# Patient Record
Sex: Female | Born: 1990 | Race: Asian | Hispanic: No | Marital: Married | Smoking: Former smoker
Health system: Southern US, Community
[De-identification: ages and names within clinical notes are randomized; demographics above are authoritative.]

## PROBLEM LIST (undated history)

## (undated) ENCOUNTER — Inpatient Hospital Stay: Payer: Self-pay

## (undated) DIAGNOSIS — J45909 Unspecified asthma, uncomplicated: Secondary | ICD-10-CM

---

## 2014-06-10 HISTORY — PX: OTHER SURGICAL HISTORY: SHX169

## 2017-03-26 ENCOUNTER — Encounter: Payer: Self-pay | Admitting: Emergency Medicine

## 2017-03-26 ENCOUNTER — Emergency Department: Payer: Managed Care, Other (non HMO)

## 2017-03-26 ENCOUNTER — Emergency Department
Admission: EM | Admit: 2017-03-26 | Discharge: 2017-03-26 | Disposition: A | Discharge status: Home / Self Care | Payer: Managed Care, Other (non HMO) | Attending: Student in an Organized Health Care Education/Training Program | Admitting: Student in an Organized Health Care Education/Training Program

## 2017-03-26 DIAGNOSIS — K6289 Other specified diseases of anus and rectum: Secondary | ICD-10-CM | POA: Diagnosis present

## 2017-03-26 DIAGNOSIS — J45909 Unspecified asthma, uncomplicated: Secondary | ICD-10-CM | POA: Insufficient documentation

## 2017-03-26 DIAGNOSIS — K610 Anal abscess: Secondary | ICD-10-CM | POA: Diagnosis not present

## 2017-03-26 DIAGNOSIS — L0291 Cutaneous abscess, unspecified: Secondary | ICD-10-CM

## 2017-03-26 DIAGNOSIS — Z87891 Personal history of nicotine dependence: Secondary | ICD-10-CM | POA: Insufficient documentation

## 2017-03-26 HISTORY — DX: Unspecified asthma, uncomplicated: J45.909

## 2017-03-26 LAB — CBC
HEMATOCRIT: 36.2 % (ref 35.0–47.0)
Hemoglobin: 12.2 g/dL (ref 12.0–16.0)
MCH: 31.2 pg (ref 26.0–34.0)
MCHC: 33.6 g/dL (ref 32.0–36.0)
MCV: 92.8 fL (ref 80.0–100.0)
Platelets: 349 10*3/uL (ref 150–440)
RBC: 3.9 MIL/uL (ref 3.80–5.20)
RDW: 12.5 % (ref 11.5–14.5)
WBC: 13.8 10*3/uL — ABNORMAL HIGH (ref 3.6–11.0)

## 2017-03-26 LAB — POCT PREGNANCY, URINE: Preg Test, Ur: NEGATIVE

## 2017-03-26 LAB — COMPREHENSIVE METABOLIC PANEL
ALBUMIN: 3.8 g/dL (ref 3.5–5.0)
ALT: 13 U/L — ABNORMAL LOW (ref 14–54)
ANION GAP: 8 (ref 5–15)
AST: 18 U/L (ref 15–41)
Alkaline Phosphatase: 53 U/L (ref 38–126)
BILIRUBIN TOTAL: 0.5 mg/dL (ref 0.3–1.2)
BUN: 8 mg/dL (ref 6–20)
CALCIUM: 9.2 mg/dL (ref 8.9–10.3)
CO2: 22 mmol/L (ref 22–32)
Chloride: 106 mmol/L (ref 101–111)
Creatinine, Ser: 0.6 mg/dL (ref 0.44–1.00)
GFR calc non Af Amer: >60 mL/min (ref >60)
GLUCOSE: 113 mg/dL — AB (ref 65–99)
POTASSIUM: 3.9 mmol/L (ref 3.5–5.1)
SODIUM: 136 mmol/L (ref 135–145)
TOTAL PROTEIN: 7.6 g/dL (ref 6.5–8.1)

## 2017-03-26 MED ORDER — IOPAMIDOL (ISOVUE-300) INJECTION 61%
100.0000 mL | Freq: Once | INTRAVENOUS | Status: AC | PRN
  Administered 2017-03-26: 100 mL via INTRAVENOUS
  Filled 2017-03-26: qty 100

## 2017-03-26 MED ORDER — OXYCODONE-ACETAMINOPHEN 5-325 MG PO TABS
1.0000 | ORAL_TABLET | Freq: Once | ORAL | Status: AC
  Administered 2017-03-26: 1 via ORAL
  Filled 2017-03-26: qty 1

## 2017-03-26 MED ORDER — OXYCODONE-ACETAMINOPHEN 5-325 MG PO TABS: 1.0000 | ORAL_TABLET | Freq: Four times a day (QID) | ORAL | 0 refills | Status: DC | PRN

## 2017-03-26 MED ORDER — SULFAMETHOXAZOLE-TRIMETHOPRIM 800-160 MG PO TABS: 1.0000 | ORAL_TABLET | Freq: Two times a day (BID) | ORAL | 0 refills | Status: DC

## 2017-03-26 NOTE — ED Provider Notes (Signed)
[facility] Emergency Department Provider Note  ____________________________________________  Time seen: Approximately 3:59 PM  I have reviewed the triage vital signs and the nursing notes.   HISTORY  Chief Complaint Abscess    HPI Zoe Maldonado is a 26 y.o. female that presents to the emergency department for evaluation of painful area near her rectum for 3 days. No drainage from site. Pain is worse with walking. Patient states that she's had 2 previous abscesses in that area. One of the abscesses had to be drained and the other resolved on its own. Pain is worse with walking. No fever, chills, nausea, vomiting, abdominal pain.   Past Medical History:  Diagnosis Date  . Asthma    as a child    There are no active problems to display for this patient.   History reviewed. No pertinent surgical history.  Prior to Admission medications   Medication Sig Start Date End Date Taking? Authorizing Provider  oxyCODONE-acetaminophen (ROXICET) 5-325 MG tablet Take 1 tablet by mouth every 6 (six) hours as needed. 03/26/17 03/29/17  Enid Derry, PA-C  sulfamethoxazole-trimethoprim (BACTRIM DS,SEPTRA DS) 800-160 MG tablet Take 1 tablet by mouth 2 (two) times daily. 03/26/17   Enid Derry, PA-C    Allergies Patient has no known allergies.  No family history on file.  Social History Social History  Substance Use Topics  . Smoking status: Former Games developer  . Smokeless tobacco: Never Used  . Alcohol use Not on file     Review of Systems  Constitutional: No fever/chills Cardiovascular: No chest pain. Respiratory:No SOB. Gastrointestinal: No abdominal pain.  No nausea, no vomiting.  Skin: Negative for abrasions, lacerations, ecchymosis. Neurological: Negative for headaches, numbness or tingling   ____________________________________________   PHYSICAL EXAM:  VITAL SIGNS: ED Triage Vitals  Enc Vitals Group     BP 03/26/17 1520 121/68      Pulse Rate 03/26/17 1520 97     Resp 03/26/17 1520 16     Temp 03/26/17 1520 98.8 F (37.1 C)     Temp Source 03/26/17 1520 Oral     SpO2 03/26/17 1520 100 %     Weight 03/26/17 1518 156 lb (70.8 kg)     Height 03/26/17 1518 5\' 3"  (1.6 m)     Head Circumference --      Peak Flow --      Pain Score 03/26/17 1518 8     Pain Loc --      Pain Edu? --      Excl. in GC? --      Constitutional: Alert and oriented. Well appearing and in no acute distress. Eyes: Conjunctivae are normal. PERRL. EOMI. Head: Atraumatic. ENT:      Ears:      Nose: No congestion/rhinnorhea.      Mouth/Throat: Mucous membranes are moist.  Neck: No stridor.  Cardiovascular: Normal rate, regular rhythm.  Good peripheral circulation. Respiratory: Normal respiratory effort without tachypnea or retractions. Lungs CTAB. Good air entry to the bases with no decreased or absent breath sounds. Gastrointestinal: Bowel sounds 4 quadrants. Soft and nontender to palpation. No guarding or rigidity. No palpable masses. No distention. Musculoskeletal: Full range of motion to all extremities. No gross deformities appreciated. Rectal: PA student Toma Copier present for rectal exam. Tender area near her rectum on left buttocks. No area of fluctuance or swelling. No erythema. Neurologic:  Normal speech and language. No gross focal neurologic deficits are appreciated.  Skin:  Skin is warm, dry and intact. No  rash noted.   ____________________________________________   LABS (all labs ordered are listed, but only abnormal results are displayed)  Labs Reviewed  CBC - Abnormal; Notable for the following:       Result Value   WBC 13.8 (*)    All other components within normal limits  COMPREHENSIVE METABOLIC PANEL - Abnormal; Notable for the following:    Glucose, Bld 113 (*)    ALT 13 (*)    All other components within normal limits  POC URINE PREG, ED  POCT PREGNANCY, URINE    ____________________________________________  EKG   ____________________________________________  RADIOLOGY  Ct Pelvis W Contrast  Result Date: 03/26/2017 CLINICAL DATA:  Perineal abscess. EXAM: CT PELVIS WITH CONTRAST TECHNIQUE: Multidetector CT imaging of the pelvis was performed using the standard protocol following the bolus administration of intravenous contrast. CONTRAST:  ISOVUE-300 IOPAMIDOL (ISOVUE-300) INJECTION 61% COMPARISON:  None. FINDINGS: Urinary Tract: Visualized distal ureters and urinary bladder are normal. Bowel: Normal visualize colon and small bowel. At the left posterior aspect of the anal opening, there is a bilobed collection that measures 2.2 x 1.1 cm in. There is associated surrounding inflammation. Vascular/Lymphatic: No pathologically enlarged lymph nodes. No significant vascular abnormality seen. Reproductive:  Normal uterus and ovaries. Other:  None Musculoskeletal: Normal IMPRESSION: Small, 2.2 x 1.1 cm abscess of the left posterior aspect of the anal opening. Surrounding inflammatory change limits assessment for sinus tract. If there is concern for fistula en ano, high-resolution pelvic MRI may be helpful. Electronically Signed   By: Deatra Robinson M.D.   On: 03/26/2017 17:35    ____________________________________________    PROCEDURES  Procedure(s) performed:    Procedures    Medications  iopamidol (ISOVUE-300) 61 % injection 100 mL (100 mLs Intravenous Contrast Given 03/26/17 1716)  oxyCODONE-acetaminophen (PERCOCET/ROXICET) 5-325 MG per tablet 1 tablet (1 tablet Oral Given 03/26/17 1935)     ____________________________________________   INITIAL IMPRESSION / ASSESSMENT AND PLAN / ED COURSE  Pertinent labs & imaging results that were available during my care of the patient were reviewed by me and considered in my medical decision making (see chart for details).  Review of the [place] CSRS was performed in accordance of the [facility] prior to  dispensing any controlled drugs.   Patient's diagnosis is consistent with perirectal abscess. Vital signs and exam are reassuring. CT shows 2.1cm x 1.1cm abscess on the left posterior aspect of the anal opening. Dr. Everlene Farrier was consulted and he recommend that patient start antibiotics and follow up with him in clinic. There is no area of fluctuance or area of erythema that I am able to drain. Patient will be discharged home with prescriptions for Bactrim. Patient is to follow up with general surgery as directed. Patient is given ED precautions to return to the ED for any worsening or new symptoms.   ____________________________________________  FINAL CLINICAL IMPRESSION(S) / ED DIAGNOSES  Final diagnoses:  Abscess      NEW MEDICATIONS STARTED DURING THIS VISIT:  Discharge Medication List as of 03/26/2017  7:21 PM    START taking these medications   Details  oxyCODONE-acetaminophen (ROXICET) 5-325 MG tablet Take 1 tablet by mouth every 6 (six) hours as needed., Starting Wed 03/26/2017, Until Sat 03/29/2017, Print    sulfamethoxazole-trimethoprim (BACTRIM DS,SEPTRA DS) 800-160 MG tablet Take 1 tablet by mouth 2 (two) times daily., Starting Wed 03/26/2017, Print            This chart was dictated using voice recognition software/Dragon. Despite best  efforts to proofread, errors can occur which can change the meaning. Any change was purely unintentional.    Enid DerryWagner, Cheyla Duchemin, PA-C 03/26/17 2020    Willy Eddyobinson, Patrick, MD 03/26/17 2024

## 2017-03-26 NOTE — ED Triage Notes (Signed)
Cyst to perineum x 4 days

## 2017-03-28 ENCOUNTER — Telehealth: Payer: Self-pay

## 2017-03-28 NOTE — Telephone Encounter (Signed)
Patient's husband called and stated that his wife is not feeling better since starting the antibiotics and pain medication. He wanted to know how long should it take for her to start having some relief. I advised him to take her to the ED if she is not feeling better by tomorrow. I asked if there was any redness, fever/chills, or any drainage. He denied all the above expect that she had a low grade fever of 99.5. I did offer an appointment if they decide not to go to the ED. He accepted the appointment and verbalized understanding.

## 2017-03-29 ENCOUNTER — Emergency Department
Admission: EM | Admit: 2017-03-29 | Discharge: 2017-03-29 | Disposition: A | Discharge status: Home / Self Care | Payer: Managed Care, Other (non HMO) | Attending: Emergency Medicine | Admitting: Emergency Medicine

## 2017-03-29 ENCOUNTER — Encounter: Payer: Self-pay | Admitting: Emergency Medicine

## 2017-03-29 DIAGNOSIS — K610 Anal abscess: Secondary | ICD-10-CM | POA: Diagnosis not present

## 2017-03-29 DIAGNOSIS — L0231 Cutaneous abscess of buttock: Secondary | ICD-10-CM | POA: Diagnosis present

## 2017-03-29 DIAGNOSIS — K612 Anorectal abscess: Secondary | ICD-10-CM | POA: Diagnosis not present

## 2017-03-29 DIAGNOSIS — Z87891 Personal history of nicotine dependence: Secondary | ICD-10-CM | POA: Diagnosis not present

## 2017-03-29 DIAGNOSIS — J45909 Unspecified asthma, uncomplicated: Secondary | ICD-10-CM | POA: Insufficient documentation

## 2017-03-29 MED ORDER — OXYCODONE-ACETAMINOPHEN 5-325 MG PO TABS: 1.0000 | ORAL_TABLET | Freq: Four times a day (QID) | ORAL | 0 refills | Status: AC | PRN

## 2017-03-29 MED ORDER — LIDOCAINE HCL (PF) 1 % IJ SOLN
INTRAMUSCULAR | Status: AC
  Filled 2017-03-29: qty 10

## 2017-03-29 MED ORDER — OXYCODONE-ACETAMINOPHEN 5-325 MG PO TABS
1.0000 | ORAL_TABLET | Freq: Once | ORAL | Status: AC
  Administered 2017-03-29: 1 via ORAL
  Filled 2017-03-29: qty 1

## 2017-03-29 NOTE — Discharge Instructions (Addendum)
You have been seen in the Emergency Department (ED) today for an abscess.  This was drained in the ED by Dr. Earlene Plateravis (general surgery).  Continue taking your antibiotics as prescribed.  Please follow up in the surgery clinic as scheduled.  Read through the additional discharge instructions included below regarding wound care recommendations.  Keep the wound clean and dry, though you may wash as you would normally.  Change the dressing twice daily.  Consider using Sitz baths to help the wound drain and stay clean (see included information).  Call your doctor sooner or return to the ED if you develop worsening signs of infection such as: increased redness, increased pain, pus, or fever.

## 2017-03-29 NOTE — ED Provider Notes (Signed)
[facility] Emergency Department Provider Note  ____________________________________________   First MD Initiated Contact with Patient 03/29/17 [phone]     (approximate)  I have reviewed the triage vital signs and the nursing notes.   HISTORY  Chief Complaint Abscess    HPI Zoe Maldonado is a 26 y.o. female with no chronic medical issues but who has had a prior buttocks/perianal abscess that drained and resolved on its own without incision and drainage.  She presents for reassessment of an abscess on her proximal and medial left buttock for which she was seen in the emergency department 2-3 days ago.  At the time she had a CT pelvis that demonstrated a bilobar phlegmon versus abscess.  A phone consult with Dr. Everlene Farrier was placed and he recommended that the patient start on antibiotics and be seen in his clinic next week.  She has a follow-up appointment in about 5 days, but the pain has continued to be severe and worse with any kind of walking or pressure.  She spiked a fever to 102 tonight and she feels like the area is getting bigger or possibly "migrating".  She feels like it is not as close to her anus as it was previously but before it was firm to the touch and now it is soft and tender.  There does not seem to be any redness extending away from the infected area. she denies nausea, vomiting, chest pain, shortness of breath, diarrhea, and dysuria.  She has been taking her Bactrim DS (1 tablet twice a day) as prescribed.  Symptoms have been gradual in onset but are severe.  Past Medical History:  Diagnosis Date  . Asthma    as a child    There are no active problems to display for this patient.   History reviewed. No pertinent surgical history.  Prior to Admission medications   Medication Sig Start Date End Date Taking? Authorizing Provider  oxyCODONE-acetaminophen (ROXICET) 5-325 MG tablet Take 1 tablet by mouth every 6 (six) hours as needed.  03/29/17 04/01/17  Loleta Rose, MD  sulfamethoxazole-trimethoprim (BACTRIM DS,SEPTRA DS) 800-160 MG tablet Take 1 tablet by mouth 2 (two) times daily. 03/26/17   Enid Derry, PA-C    Allergies Patient has no known allergies.  No family history on file.  Social History Social History  Substance Use Topics  . Smoking status: Former Games developer  . Smokeless tobacco: Never Used  . Alcohol use Yes     Comment: Rarely    Review of Systems Constitutional: Fever to 102 tonight at home, took ibuprofen prior to coming to the ED Cardiovascular: Denies chest pain. Respiratory: Denies shortness of breath. Gastrointestinal: No abdominal pain.  No nausea, no vomiting.  No diarrhea.  No constipation. Genitourinary: Negative for dysuria. Musculoskeletal: Negative for neck pain.  Negative for back pain. Integumentary: Large area of tenderness and swelling on proximal/medial left buttock near the anus Neurological: Negative for headaches, focal weakness or numbness.   ____________________________________________   PHYSICAL EXAM:  VITAL SIGNS: ED Triage Vitals [03/29/17 0012]  Enc Vitals Group     BP 135/79     Pulse Rate (!) 113     Resp 18     Temp 99.4 F (37.4 C)     Temp Source Oral     SpO2 97 %     Weight 70.8 kg (156 lb)     Height 1.6 m (5\' 3" )     Head Circumference      Peak Flow  Pain Score 7     Pain Loc      Pain Edu?      Excl. in GC?     Constitutional: Alert and oriented. Well appearing and in no acute distress. Eyes: Conjunctivae are normal.  Head: Atraumatic. Cardiovascular: Normal rate, regular rhythm. Good peripheral circulation.  Respiratory: Normal respiratory effort.  No retractions.  GU:  Several cm fluctuant area on proximal/medial left buttock.  Does not seem to track to anus with no induration leading from area to the anus, and no obvious break in the skin nor fistula.  Severely tender to palpation. Musculoskeletal: No lower extremity tenderness  nor edema. No gross deformities of extremities. Neurologic:  Normal speech and language. No gross focal neurologic deficits are appreciated.  Skin:  Skin is warm, dry and intact. No rash noted. Psychiatric: Mood and affect are normal. Speech and behavior are normal.  ____________________________________________   LABS (all labs ordered are listed, but only abnormal results are displayed)  Labs Reviewed  AEROBIC/ANAEROBIC CULTURE (SURGICAL/DEEP WOUND)   ____________________________________________  EKG  None - EKG not ordered by ED physician ____________________________________________  RADIOLOGY   No results found.  ____________________________________________   PROCEDURES  Critical Care performed: No   Procedure(s) performed:   Procedures   ____________________________________________   INITIAL IMPRESSION / ASSESSMENT AND PLAN / ED COURSE  As part of my medical decision making, I reviewed the following data within the electronic MEDICAL RECORD NUMBER Nursing notes reviewed and incorporated, A consult was requested and obtained from this/these consultant(s) Surgery, prior CT pelvis report,  and Notes from prior ED visits    Given the location of the abscess/phlegmon and the "migration" or development of the infected area since the last evaluation, I called and spoke by phone with Dr. Earlene Plateravis (general surgery).  He evaluated the patient's CT scan and we discussed the case by phone; he will come to the ED to evaluate the patient personally and I&D the area if appropriate.  Updated patient and family.   Clinical Course as of Mar 29 805  Sat Mar 29, 2017  0510 Dr. Earlene Plateravis has gone in to evaluate the patient and I&D the abscess.  [CF]  0605 Dr. Earlene Plateravis performed incision and drainage of the abscess with return of copious purulent foul-smelling material.  He sent a culture.  He recommended continuing antibiotics and keeping the patient's follow-up appointment with Dr. Everlene FarrierPabon next  week.  I gave the patient my usual customary recommendations and I gave her another prescription for an additional 8 Percocet given the I&D although I did explain the pain should be improving now that the wound has been incised.   I gave my usual and customary return precautions.     [CF]    Clinical Course User Index [CF] Loleta RoseForbach, Jazlyne Gauger, MD    ____________________________________________  FINAL CLINICAL IMPRESSION(S) / ED DIAGNOSES  Final diagnoses:  Abscess of left buttock     MEDICATIONS GIVEN DURING THIS VISIT:  Medications  lidocaine (PF) (XYLOCAINE) 1 % injection (not administered)  oxyCODONE-acetaminophen (PERCOCET/ROXICET) 5-325 MG per tablet 1 tablet (1 tablet Oral Given 03/29/17 0601)     NEW OUTPATIENT MEDICATIONS STARTED DURING THIS VISIT:  Discharge Medication List as of 03/29/2017  6:54 AM      Discharge Medication List as of 03/29/2017  6:54 AM    CONTINUE these medications which have CHANGED   Details  oxyCODONE-acetaminophen (ROXICET) 5-325 MG tablet Take 1 tablet by mouth every 6 (six) hours as needed., Starting Sat  03/29/2017, Until Tue 04/01/2017, Print        Discharge Medication List as of 03/29/2017  6:54 AM       Note:  This document was prepared using Dragon voice recognition software and may include unintentional dictation errors.    Loleta Rose, MD 03/29/17 [id number]

## 2017-03-29 NOTE — ED Notes (Addendum)
Patient c/o fever of 102 at home. Patient took 1000 mg of ibuprofen at approx 2330 10/19. Patient c/o pain to area.   Patient was instructed that maximum single dose of ibuprofen is 800 mg. Patient verbalized understanding.

## 2017-03-29 NOTE — ED Notes (Signed)
ED Provider at bedside. 

## 2017-03-29 NOTE — ED Notes (Signed)
RN to bedside with MD York CeriseForbach to assess abscess.

## 2017-03-29 NOTE — Consult Note (Addendum)
SURGICAL CONSULTATION NOTE (initial) - cpt: 16109  HISTORY OF PRESENT ILLNESS (HPI):  26 y.o. female presented to [facility] ED for evaluation of perianal pain. Patient reports she first began to develop Left perianal pain 5 days ago, which gradually worsened until it became severe 3 days ago, at which time she presented to [facility] ED for initial evaluation. At that time, she was prescribed a course of Bactrim and outpatient surgical follow-up due to no appreciable fluctuance per ED physician evaluation and CT demonstrating small perianal abscess. She has been taking the prescribed antibiotic, but returned today due to persistence of her unchanged/unimproved pain and development of a soft painful and swollen perineal area. She reports a similar episode 2 years ago, for which she was prescribed oral antibiotics by urgent care facility and abscess subsequently drained spontaneously. She otherwise reports fever to 102 tonight, denies abdominal pain, N/V, diarrhea/constipation, CP, or SOB.  Surgery is consulted by ED physician Dr. York Cerise in this context for evaluation and management of perianal abscess.  PAST MEDICAL HISTORY (PMH):  Past Medical History:  Diagnosis Date  . Asthma    as a child     PAST SURGICAL HISTORY (PSH):  History reviewed. No pertinent surgical history.   MEDICATIONS:  Prior to Admission medications   Medication Sig Start Date End Date Taking? Authorizing Provider  oxyCODONE-acetaminophen (ROXICET) 5-325 MG tablet Take 1 tablet by mouth every 6 (six) hours as needed. 03/26/17 03/29/17  Enid Derry, PA-C  sulfamethoxazole-trimethoprim (BACTRIM DS,SEPTRA DS) 800-160 MG tablet Take 1 tablet by mouth 2 (two) times daily. 03/26/17   Enid Derry, PA-C     ALLERGIES:  No Known Allergies   SOCIAL HISTORY:  Social History   Social History  . Marital status: Married    Spouse name: N/A  . Number of children: N/A  . Years of education: N/A   Occupational History  . Not on  file.   Social History Main Topics  . Smoking status: Former Games developer  . Smokeless tobacco: Never Used  . Alcohol use Yes     Comment: Rarely  . Drug use: Yes    Types: Marijuana  . Sexual activity: Not on file   Other Topics Concern  . Not on file   Social History Narrative  . No narrative on file    The patient currently resides (home / rehab facility / nursing home): Home The patient normally is (ambulatory / bedbound): Ambulatory   FAMILY HISTORY:  No family history on file.   REVIEW OF SYSTEMS:  Constitutional: denies weight loss, fever, chills, or sweats  Eyes: denies any other vision changes, history of eye injury  ENT: denies sore throat, hearing problems  Respiratory: denies shortness of breath, wheezing  Cardiovascular: denies chest pain, palpitations  Gastrointestinal: abdominal pain, N/V, and bowel function as per HPI Genitourinary: denies burning with urination or urinary frequency Musculoskeletal: denies any other joint pains or cramps  Skin: denies any other rashes or skin discolorations  Neurological: denies any other headache, dizziness, weakness  Psychiatric: denies any other depression, anxiety   All other review of systems were negative   VITAL SIGNS:  Temp:  [98.2 F (36.8 C)-99.4 F (37.4 C)] 98.2 F (36.8 C) (10/20 0325) Pulse Rate:  [70-113] 70 (10/20 0325) Resp:  [17-18] 17 (10/20 0325) BP: (111-135)/(65-79) 111/65 (10/20 0325) SpO2:  [97 %-100 %] 100 % (10/20 0325) Weight:  [156 lb (70.8 kg)] 156 lb (70.8 kg) (10/20 0012)     Height: 5\' 3"  (160  cm) Weight: 156 lb (70.8 kg) BMI (Calculated): 27.64   INTAKE/OUTPUT:  This shift: No intake/output data recorded.  Last 2 shifts: @IOLAST2SHIFTS @   PHYSICAL EXAM:  Constitutional:  -- Normal body habitus  -- Awake, alert, and oriented x3  Eyes:  -- Pupils equally round and reactive to light  -- No scleral icterus  Ear, nose, and throat:  -- No jugular venous distension  Pulmonary:  -- No  crackles  -- Equal breath sounds bilaterally -- Breathing non-labored at rest Cardiovascular:  -- S1, S2 present  -- No pericardial rubs Gastrointestinal:  -- Abdomen soft, nontender, non-distended, no guarding or rebound tenderness -- Soft and exquisitely tender and fluctuant Left perineal site midway between anus and vagina, minimal surrounding erythema -- No abdominal masses appreciated, pulsatile or otherwise  Musculoskeletal and Integumentary:  -- Wounds or skin discoloration: None appreciated except as described above -- Extremities: B/L UE and LE FROM, hands and feet warm, no edema  Neurologic:  -- Motor function: intact and symmetric -- Sensation: intact and symmetric  Labs:  CBC Latest Ref Rng & Units 03/26/2017  WBC 3.6 - 11.0 K/uL 13.8(H)  Hemoglobin 12.0 - 16.0 g/dL 52.812.2  Hematocrit 41.335.0 - 47.0 % 36.2  Platelets 150 - 440 K/uL 349   CMP Latest Ref Rng & Units 03/26/2017  Glucose 65 - 99 mg/dL 244(W113(H)  BUN 6 - 20 mg/dL 8  Creatinine 1.020.44 - 7.251.00 mg/dL 3.660.60  Sodium 440135 - 347145 mmol/L 136  Potassium 3.5 - 5.1 mmol/L 3.9  Chloride 101 - 111 mmol/L 106  CO2 22 - 32 mmol/L 22  Calcium 8.9 - 10.3 mg/dL 9.2  Total Protein 6.5 - 8.1 g/dL 7.6  Total Bilirubin 0.3 - 1.2 mg/dL 0.5  Alkaline Phos 38 - 126 U/L 53  AST 15 - 41 U/L 18  ALT 14 - 54 U/L 13(L)   Imaging studies:  CT Pelvis with Contrast (03/26/2017) - personally reviewed with patient and her husband bedside At the left posterior aspect of the anal opening, there is a bilobed collection that measures 2.2 x 1.1 cm in. There is associated surrounding inflammation.  Assessment/Plan: (ICD-10's: 54K61.2) 26 y.o. female with recurrent perineal/perianal abscess.   - all risks, benefits, and alternatives to incision and drainage of perineal abscess were discussed with the patient and her husband, all of their questions were answered to their expressed satisfaction, patient expresses she wishes to proceed, and informed consent  was obtained and documented.  - bedside incision and drainage of recurrent Left perineal abscess performed (see procedural note)  - instructions regarding wound care and hygiene provided and discussed  - complete course of prescribed oral antibiotics   - outpatient follow-up as previously scheduled   - instructed to call if questions/concerns  All of the above findings and recommendations were discussed with the patient, her family, and ED physician and RN, and all of patient's and her family's questions were answered to their expressed satisfaction.  Thank you for the opportunity to participate in this patient's care.   -- Scherrie GerlachJason E. Earlene Plateravis, MD, RPVI [place]: [facility] - Partnering for exceptional care. Office: [phone]

## 2017-03-29 NOTE — Procedures (Addendum)
SURGICAL PROCEDURAL REPORT  DATE OF PROCEDURE: 03/29/2017  SURGICAL ATTENDING: Scherrie GerlachJason E. Earlene Plateravis, MD  ANESTHESIA: Local  PRE-OPERATIVE DIAGNOSIS: Left perineal/perianal abscess (icd-10's: K61.2)  POST-OPERATIVE DIAGNOSIS: Left perineal/perianal abscess (icd-10's: K61.2)  PROCEDURE(S):  1.) Incision and drainage of simple bilobed Left perineal/perianal abscess (cpt: 46050)  INTRAOPERATIVE FINDINGS: ~50 mL of foul-smelling brownish/greenish purulent fluid under pressure from Left perineum, tracking towards anus  INTRAVENOUS FLUIDS: 0 mL crystalloid   ESTIMATED BLOOD LOSS: Minimal (<20 mL)   URINE OUTPUT: No Foley catheter   SPECIMENS: Contents of Left perineal/perianal abscess for culture  IMPLANTS: None  DRAINS: None  COMPLICATIONS: None apparent  CONDITION AT END OF PROCEDURE: Hemodynamically stable and awake  DISPOSITION OF PATIENT: ED Room (unchanged)  INDICATIONS FOR PROCEDURE:  Patient is a 26 y.o. female who [facility] perineal pain and swelling x 5 days, for which patient was started on oral antibiotics x 2 days by ED physician. All risks, benefits, and alternatives to incision and drainage of Left perineal/perianal abscess were discussed with the patient, all of patient's and her husband's questions were answered to their expressed satisfaction, and informed consent was accordingly obtained and documented.  DETAILS OF PROCEDURE: Remaining in patient's ED bed in supine position, operative site was prepped in the usual sterile fashion, and following a brief time out, local anesthetic was injected into and surrounding the abscess, after which the focus of maximal fluctuance was identified and a 1.5 cm incision was made using a #11 blade scalpel, upon which >50 mL of foul-smelling brownish/greenish-purulent fluid was immediately released and deep abscess culture was obtained. Additional fluid was  then expressed. The abscess cavity was then irrigated and dried. Surrounding skin was then cleaned and dried, and a sterile dry gauze and adhesive dressing was applied.  I was present for all aspects of the above procedure, and there were no complications apparent.

## 2017-03-29 NOTE — ED Triage Notes (Signed)
Pt ambulatory to triage with c/o groin abscess. Pt was seen in ED on 10/17 for the same and given Bactrim. Per pt, abscess has grown bigger in this time. Pt is in NAD.

## 2017-04-02 ENCOUNTER — Ambulatory Visit (INDEPENDENT_AMBULATORY_CARE_PROVIDER_SITE_OTHER): Payer: Managed Care, Other (non HMO) | Admitting: Surgery

## 2017-04-02 ENCOUNTER — Encounter: Payer: Self-pay | Admitting: Surgery

## 2017-04-02 VITALS — BP 108/72 | HR 66 | Temp 98.2°F | Ht 63.0 in | Wt 158.0 lb

## 2017-04-02 DIAGNOSIS — K610 Anal abscess: Secondary | ICD-10-CM | POA: Diagnosis not present

## 2017-04-02 NOTE — Patient Instructions (Signed)
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  Please continue to finish your Antibiotics.  Please keep a dressing over the area as long as you have drainage.  Please call our office if you have questions or concerns.   How to Take a Sitz Bath A sitz bath is a warm water bath that is taken while you are sitting down. The water should only come up to your hips and should cover your buttocks. Your health care provider may recommend a sitz bath to help you:  Clean the lower part of your body, including your genital area.  With itching.  With pain.  With sore muscles or muscles that tighten or spasm.  How to take a sitz bath Take 3-4 sitz baths per day or as told by your health care provider. 1. Partially fill a bathtub with warm water. You will only need the water to be deep enough to cover your hips and buttocks when you are sitting in it. 2. If your health care provider told you to put medicine in the water, follow the directions exactly. 3. Sit in the water and open the tub drain a little. 4. Turn on the warm water again to keep the tub at the correct level. Keep the water running constantly. 5. Soak in the water for 15-20 minutes or as told by your health care provider. 6. After the sitz bath, pat the affected area dry first. Do not rub it. 7. Be careful when you stand up after the sitz bath because you may feel dizzy.  Contact a health care provider if:  Your symptoms get worse. Do not continue with sitz baths if your symptoms get worse.  You have new symptoms. Do not continue with sitz baths until you talk with your health care provider. This information is not intended to replace advice given to you by your health care provider. Make sure you discuss any questions you have with your health care provider. Document Released: 02/17/2004 Document Revised: 10/25/2015 Document Reviewed: 05/25/2014 Elsevier Interactive Patient  Education  [employer]2018 [employer]vier [employer]

## 2017-04-02 NOTE — Progress Notes (Signed)
F/U s/p I/S abscess Doing well Taking A/Bs  PE NAD Abd: soft, Nt Exam revealed a wound healing well, left labia wound  more c/w Bartholin abscess  A/P Doing well To r/o Bartholin gland issues I will refer to Gyn May f/u prn

## 2017-04-03 LAB — AEROBIC/ANAEROBIC CULTURE (SURGICAL/DEEP WOUND)

## 2017-04-03 LAB — AEROBIC/ANAEROBIC CULTURE W GRAM STAIN (SURGICAL/DEEP WOUND)

## 2017-04-04 ENCOUNTER — Telehealth: Payer: Self-pay | Admitting: Obstetrics and Gynecology

## 2017-04-04 NOTE — Telephone Encounter (Signed)
[facility] referring pt for Consultation with [facility]. Lvm for patient to call back to be schedule

## 2017-04-08 NOTE — Telephone Encounter (Signed)
After Hour nurse line Caller states his wife was referred to clinic. She schedule for April 14, 2017 and they are hoping to get in earlier. Bartholin gland abscess and she feels like th pain and pressure is coming back. I called and left voicemail explain she has been add to the waitlist for Cancellations once to appointment come up to will call her with a new appointment

## 2017-04-09 ENCOUNTER — Encounter: Payer: Self-pay | Admitting: Obstetrics and Gynecology

## 2017-04-09 ENCOUNTER — Ambulatory Visit (INDEPENDENT_AMBULATORY_CARE_PROVIDER_SITE_OTHER): Payer: Managed Care, Other (non HMO) | Admitting: Obstetrics and Gynecology

## 2017-04-09 VITALS — BP 102/66 | HR 93 | Ht 63.0 in | Wt 159.0 lb

## 2017-04-09 DIAGNOSIS — K610 Anal abscess: Secondary | ICD-10-CM | POA: Diagnosis not present

## 2017-04-09 DIAGNOSIS — Z3009 Encounter for other general counseling and advice on contraception: Secondary | ICD-10-CM

## 2017-04-10 NOTE — Progress Notes (Signed)
Obstetrics & Gynecology Office Visit   Chief Complaint:  Chief Complaint  Patient presents with  . Lung Abcess    Referred by [facility]    History of Present Illness: 26 year old G0P0000 presenting with recently I&D perianal abscess but question about possible bartholin's abscess.  The patient has completed a course of po antibiotics following the I&D and feels symptoms have improved significantly.  She still notes some discharge from the site occasionally, no fevers no chills.  She does not have a prior history or symptoms of inflammatory bowl disease.    She has not has a similar problem in the past.  Unable to relate any inciting event or trauma.    CT imaging was read as consistent with perianal abscess located left and posterior to the anal opening.    Review of Systems: Review of Systems  Constitutional: Negative for chills and fever.  Gastrointestinal: Negative for abdominal pain, constipation, diarrhea, heartburn, nausea and vomiting.  Genitourinary: Negative.   Skin: Negative for itching and rash.    Past Medical History:  Past Medical History:  Diagnosis Date  . Asthma    as a child    Past Surgical History:  Past Surgical History:  Procedure Laterality Date  . Nexplanon  2016    Gynecologic History: Patient's last menstrual period was 04/02/2017.  Obstetric History: G0P0000  Family History:  History reviewed. No pertinent family history.  Social History:  Social History   Social History  . Marital status: Married    Spouse name: N/A  . Number of children: N/A  . Years of education: N/A   Occupational History  . Not on file.   Social History Main Topics  . Smoking status: Former Games developermoker  . Smokeless tobacco: Never Used  . Alcohol use Yes     Comment: Rarely  . Drug use: Yes    Types: Marijuana  . Sexual activity: Yes    Birth control/ protection: Implant   Other Topics Concern  . Not on file   Social History Narrative  .  No narrative on file    Allergies:  No Known Allergies  Medications: Prior to Admission medications   Medication Sig Start Date End Date Taking? Authorizing Provider  etonogestrel (NEXPLANON) 68 MG IMPL implant 1 each by Subdermal route once.   Yes [provider]    Physical Exam Vitals:  Vitals:   04/09/17 1532  BP: 102/66  Pulse: 93   Patient's last menstrual period was 04/02/2017.  General: NAD HEENT: normocephalic, anicteric Pulmonary: No increased work of breathing Genitourinary:  External: Normal external female genitalia.  Normal urethral meatus, normal Bartholin's and Skene's glands.    Rectal: deferred  Lymphatic: no evidence of inguinal lymphadenopathy Extremities: no edema, erythema, or tenderness Skin:  The area in question does not originate or involve the posterior fourchette and is located on the left buttock cheek.  This is not consistent in location with a bartholin's cyst Neurologic: Grossly intact Psychiatric: mood appropriate, affect full  Female chaperone present for pelvic portions of the physical exam  Physical Exam  Genitourinary:       Assessment: 26 y.o. G0P0000 with perianal abscess  Plan: Problem List Items Addressed This Visit      Other   Perianal abscess - Primary     - discussed location is not consistent with bartholin's cyst, which would be anterior to the anus and track into the posterior labia/vagina, both bartholin's glands are non-enlarged on  physical exam.  Based on prior CT scan read at time of ED I&D favor perianal abscess.  If continuous to drain concern would be for fistula, we discussed that this would require general surgery or colorectal surgery to address.  - patient interested in discontinuing her nexplanon secondary to weight gain and mood changes/decreased libido which she attributes to nexplanon.  She has not had an annual in several years and we will plan on seeing the patient back in 4-6 weeks for  nexplanon removal (switch to loloestrin Fe), annual exam, and re-examination of her I&D site  - Return in about 6 weeks (around 05/21/2017), or 4-6 week annual and nexplanon removal, for 4-6 week annual and nexplanon removal.

## 2017-04-14 ENCOUNTER — Encounter: Payer: Self-pay | Admitting: Obstetrics and Gynecology

## 2017-05-07 ENCOUNTER — Ambulatory Visit (INDEPENDENT_AMBULATORY_CARE_PROVIDER_SITE_OTHER): Payer: Managed Care, Other (non HMO) | Admitting: Obstetrics and Gynecology

## 2017-05-07 ENCOUNTER — Encounter: Payer: Self-pay | Admitting: Obstetrics and Gynecology

## 2017-05-07 VITALS — BP 124/78 | Ht 63.0 in | Wt 163.0 lb

## 2017-05-07 DIAGNOSIS — K611 Rectal abscess: Secondary | ICD-10-CM

## 2017-05-07 DIAGNOSIS — Z3046 Encounter for surveillance of implantable subdermal contraceptive: Secondary | ICD-10-CM

## 2017-05-07 DIAGNOSIS — Z01419 Encounter for gynecological examination (general) (routine) without abnormal findings: Secondary | ICD-10-CM | POA: Diagnosis not present

## 2017-05-07 DIAGNOSIS — Z124 Encounter for screening for malignant neoplasm of cervix: Secondary | ICD-10-CM

## 2017-05-07 DIAGNOSIS — Z23 Encounter for immunization: Secondary | ICD-10-CM | POA: Diagnosis not present

## 2017-05-07 MED ORDER — SULFAMETHOXAZOLE-TRIMETHOPRIM 800-160 MG PO TABS: 1.0000 | ORAL_TABLET | Freq: Two times a day (BID) | ORAL | 0 refills | Status: AC

## 2017-05-07 MED ORDER — NORETHIN-ETH ESTRAD-FE BIPHAS 1 MG-10 MCG / 10 MCG PO TABS: 1.0000 | ORAL_TABLET | Freq: Every day | ORAL | 3 refills | Status: DC

## 2017-05-07 NOTE — Progress Notes (Signed)
Gynecology Annual Exam  PCP: Patient, No Pcp Per  Chief Complaint:  Chief Complaint  Patient presents with  . Annual Exam    History of Present Illness: Patient is a 26 y.o. G0P0000 presents for annual exam. The patient has no complaints today.   LMP: Patient's last menstrual period was 04/02/2017. Absent secondary to nexplanon  The patient is sexually active. She currently uses Nexplanon for contraception. She denies dyspareunia.  The patient does perform self breast exams.  There is no notable family history of breast or ovarian cancer in her family.  The patient wears seatbelts: yes.  The patient has regular exercise: not asked.    The patient denies current symptoms of depression.    She has not noted any improvement in her perirectal access which has a fistulous sinus tract the the left buttock/groin.  Continues to note discharge from the area.  Rectal pressure and discomfort.    Review of Systems: Review of Systems  Constitutional: Negative for chills and fever.  HENT: Negative for congestion.   Respiratory: Negative for cough and shortness of breath.   Cardiovascular: Negative for chest pain and palpitations.  Gastrointestinal: Negative for abdominal pain, constipation, diarrhea, heartburn, nausea and vomiting.  Genitourinary: Negative for dysuria, frequency and urgency.  Skin: Negative for itching and rash.  Neurological: Negative for dizziness and headaches.  Endo/Heme/Allergies: Negative for polydipsia.  Psychiatric/Behavioral: Negative for depression.    Past Medical History:  Past Medical History:  Diagnosis Date  . Asthma    as a child    Past Surgical History:  Past Surgical History:  Procedure Laterality Date  . Nexplanon  2016    Gynecologic History:  Patient's last menstrual period was 04/02/2017. Contraception: Nexplanon Last Pap: Results were: none on record  Obstetric History: G0P0000  Family History:  History reviewed. No pertinent  family history.  Social History:  Social History   Socioeconomic History  . Marital status: Married    Spouse name: Not on file  . Number of children: Not on file  . Years of education: Not on file  . Highest education level: Not on file  Social Needs  . Financial resource strain: Not on file  . Food insecurity - worry: Not on file  . Food insecurity - inability: Not on file  . Transportation needs - medical: Not on file  . Transportation needs - non-medical: Not on file  Occupational History  . Not on file  Tobacco Use  . Smoking status: Former Games developermoker  . Smokeless tobacco: Never Used  Substance and Sexual Activity  . Alcohol use: Yes    Comment: Rarely  . Drug use: Yes    Types: Marijuana  . Sexual activity: Yes    Birth control/protection: Implant  Other Topics Concern  . Not on file  Social History Narrative  . Not on file    Allergies:  No Known Allergies  Medications: Prior to Admission medications   Medication Sig Start Date End Date Taking? Authorizing Provider  etonogestrel (NEXPLANON) 68 MG IMPL implant 1 each by Subdermal route once.    [provider]    Physical Exam Vitals: Blood pressure 124/78, height 5\' 3"  (1.6 m), weight 163 lb (73.9 kg), last menstrual period 04/02/2017.  General: NAD HEENT: normocephalic, anicteric Thyroid: no enlargement, no palpable nodules Pulmonary: No increased work of breathing, CTAB Cardiovascular: RRR, distal pulses 2+ Abdomen: NABS, soft, non-tender, non-distended.  Umbilicus without lesions.  No hepatomegaly, splenomegaly or masses palpable. No  evidence of hernia  Genitourinary:  External: Normal external female genitalia.  Normal urethral meatus, normal  Bartholin's and Skene's glands.    Vagina: Normal vaginal mucosa, no evidence of prolapse.    Cervix: Grossly normal in appearance, no bleeding  Uterus: Non-enlarged, mobile, normal contour.  No CMT  Adnexa: ovaries non-enlarged, no adnexal  masses  Rectal: deferred  Lymphatic: no evidence of inguinal lymphadenopathy Extremities: no edema, erythema, or tenderness Neurologic: Grossly intact Psychiatric: mood appropriate, affect full  Female chaperone present for pelvic portions of the physical exam   GYNECOLOGY PROCEDURE NOTE  Implanon removal discussed in detail.  Risks of infection, bleeding, nerve injury all reviewed.  Patient understands risks and desires to proceed.  Verbal consent obtained.  Patient is certain she wants the implanon removed.  All questions answered.  Procedure: Patient placed in dorsal supine with left arm above head, elbow flexed at 90 degrees, arm resting on examination table.  Implanon identified without problems.  Betadine scrub x3.  1 ml of 1% lidocaine injected under implanon device without problems.  Sterile gloves applied.  Small 0.5cm incision made at distal tip of implanon device with 11 blade scalpel.  Implanon brought to incision and grasped with a small kelly clamp.  Implanon removed intact without problems.  Pressure applied to incision.  Hemostasis obtained.  Steri-strips applied, followed by bandage and compression dressing.  Patient tolerated procedure well.  No complications.  Culture ED I&D by General Surgery Specimen Information: Abscess     Component 64mo ago  Specimen Description ABSCESS LEFT BUTTOCKS   Special Requests NONE   Gram Stain ABUNDANT WBC PRESENT, PREDOMINANTLY PMN  ABUNDANT GRAM POSITIVE RODS  ABUNDANT GRAM POSITIVE COCCI IN PAIRS IN CLUSTERS  MODERATE GRAM NEGATIVE RODS  FEW GRAM NEGATIVE COCCOBACILLI  Performed at [facility], [address] [place], [place] [address]     Culture MODERATE STREPTOCOCCUS GROUP G  MIXED ANAEROBIC FLORA PRESENT. CALL LAB IF FURTHER IID REQUIRED.     Report Status 04/03/2017 FINAL        CT SCAN 03/26/2017 CLINICAL DATA:  Perineal abscess.  EXAM: CT PELVIS WITH CONTRAST  TECHNIQUE: Multidetector CT imaging of the  pelvis was performed using the standard protocol following the bolus administration of intravenous contrast.  CONTRAST:  100mL ISOVUE-300 IOPAMIDOL (ISOVUE-300) INJECTION 61%  COMPARISON:  None.  FINDINGS: Urinary Tract: Visualized distal ureters and urinary bladder are normal.  Bowel: Normal visualize colon and small bowel. At the left posterior aspect of the anal opening, there is a bilobed collection that measures 2.2 x 1.1 cm in. There is associated surrounding inflammation.  Vascular/Lymphatic: No pathologically enlarged lymph nodes. No significant vascular abnormality seen.  Reproductive:  Normal uterus and ovaries.  Other:  None  Musculoskeletal: Normal  IMPRESSION: Small, 2.2 x 1.1 cm abscess of the left posterior aspect of the anal opening. Surrounding inflammatory change limits assessment for sinus tract. If there is concern for fistula en ano, high-resolution pelvic MRI may be helpful.  Assessment: 26 y.o. G0P0000 routine annual exam, uncomplicated Nexplanon removal  Plan: Problem List Items Addressed This Visit    None    Visit Diagnoses    Screening for malignant neoplasm of cervix    -  Primary   Relevant Orders   PapIG, CtNgTv, HPV, rfx 16/18   HPV 9-valent vaccine,Recombinat   Encounter for annual routine gynecological examination       Relevant Orders   PapIG, CtNgTv, HPV, rfx 16/18   HPV 9-valent  vaccine,Recombinat   Encounter for Nexplanon removal       Relevant Orders   HPV 9-valent vaccine,Recombinat   Abscess, perirectal       Relevant Orders   Ambulatory referral to Colorectal Surgery      1) 4) Gardasil Series discussed and if applicable offered to patient - Patient has not previously completed 3 shot series  - accepts vaccination shot 1 of series administered today  2) STI screening was offered and declined   3) ASCCP guidelines and rational discussed.  Patient opts for every 3 years screening interval  4)  Contraception - Nexplanon removed today switched to Lo Loestrin Fe  6.  Patient given post procedure precautions and asked to call for fever, chills, redness or drainage from her incision, bleeding from incision.  She understands she will likely have a small bruise near site of removal and can remove bandage tomorrow and steri-strips in approximately 1 week.  7) Perirectal abscess - referral to colorectal surgery.  Additional trial of bactrim in the interim  8) Follow up 1 year for routine annual exam

## 2017-05-09 LAB — PAPIG, CTNGTV, HPV, RFX 16/18
CHLAMYDIA, NUC. ACID AMP: NEGATIVE
GONOCOCCUS, NUC. ACID AMP: NEGATIVE
HPV, HIGH-RISK: NEGATIVE
PAP Smear Comment: 0
Trich vag by NAA: NEGATIVE

## 2017-07-07 ENCOUNTER — Ambulatory Visit: Payer: Managed Care, Other (non HMO)

## 2017-08-19 DIAGNOSIS — K603 Anal fistula: Secondary | ICD-10-CM | POA: Insufficient documentation

## 2017-11-14 ENCOUNTER — Encounter: Payer: Self-pay | Admitting: Emergency Medicine

## 2017-11-14 ENCOUNTER — Emergency Department
Admission: EM | Admit: 2017-11-14 | Discharge: 2017-11-15 | Disposition: A | Discharge status: Home / Self Care | Payer: Managed Care, Other (non HMO) | Attending: Emergency Medicine | Admitting: Emergency Medicine

## 2017-11-14 DIAGNOSIS — Z87891 Personal history of nicotine dependence: Secondary | ICD-10-CM | POA: Insufficient documentation

## 2017-11-14 DIAGNOSIS — J392 Other diseases of pharynx: Secondary | ICD-10-CM | POA: Diagnosis not present

## 2017-11-14 DIAGNOSIS — F1210 Cannabis abuse, uncomplicated: Secondary | ICD-10-CM | POA: Diagnosis not present

## 2017-11-14 DIAGNOSIS — T7840XA Allergy, unspecified, initial encounter: Secondary | ICD-10-CM | POA: Insufficient documentation

## 2017-11-14 DIAGNOSIS — Z79899 Other long term (current) drug therapy: Secondary | ICD-10-CM | POA: Diagnosis not present

## 2017-11-14 DIAGNOSIS — H5789 Other specified disorders of eye and adnexa: Secondary | ICD-10-CM | POA: Diagnosis not present

## 2017-11-14 LAB — CBC WITH DIFFERENTIAL/PLATELET
BASOS PCT: 1 %
Basophils Absolute: 0 10*3/uL (ref 0–0.1)
EOS ABS: 0.2 10*3/uL (ref 0–0.7)
EOS PCT: 2 %
HCT: 38.6 % (ref 35.0–47.0)
HEMOGLOBIN: 13.4 g/dL (ref 12.0–16.0)
Lymphocytes Relative: 32 %
Lymphs Abs: 2.9 10*3/uL (ref 1.0–3.6)
MCH: 32.7 pg (ref 26.0–34.0)
MCHC: 34.7 g/dL (ref 32.0–36.0)
MCV: 94.3 fL (ref 80.0–100.0)
MONOS PCT: 8 %
Monocytes Absolute: 0.7 10*3/uL (ref 0.2–0.9)
NEUTROS PCT: 57 %
Neutro Abs: 5 10*3/uL (ref 1.4–6.5)
PLATELETS: 354 10*3/uL (ref 150–440)
RBC: 4.09 MIL/uL (ref 3.80–5.20)
RDW: 13.3 % (ref 11.5–14.5)
WBC: 8.8 10*3/uL (ref 3.6–11.0)

## 2017-11-14 LAB — BASIC METABOLIC PANEL
Anion gap: 9 (ref 5–15)
BUN: 10 mg/dL (ref 6–20)
CALCIUM: 9.2 mg/dL (ref 8.9–10.3)
CO2: 23 mmol/L (ref 22–32)
CREATININE: 0.63 mg/dL (ref 0.44–1.00)
Chloride: 103 mmol/L (ref 101–111)
GFR calc non Af Amer: >60 mL/min (ref >60)
Glucose, Bld: 101 mg/dL — ABNORMAL HIGH (ref 65–99)
Potassium: 3.5 mmol/L (ref 3.5–5.1)
Sodium: 135 mmol/L (ref 135–145)

## 2017-11-14 LAB — HCG, QUANTITATIVE, PREGNANCY: hCG, Beta Chain, Quant, S: <1 m[IU]/mL (ref <5)

## 2017-11-14 MED ORDER — PREDNISONE 20 MG PO TABS: 40.0000 mg | ORAL_TABLET | Freq: Every day | ORAL | 0 refills | Status: DC

## 2017-11-14 MED ORDER — METHYLPREDNISOLONE SODIUM SUCC 125 MG IJ SOLR
125.0000 mg | Freq: Once | INTRAMUSCULAR | Status: AC
  Administered 2017-11-14: 125 mg via INTRAVENOUS

## 2017-11-14 MED ORDER — SODIUM CHLORIDE 0.9 % IV BOLUS
1000.0000 mL | Freq: Once | INTRAVENOUS | Status: AC
Start: 2017-11-14 — End: 2017-11-14
  Administered 2017-11-14: 1000 mL via INTRAVENOUS

## 2017-11-14 MED ORDER — METHYLPREDNISOLONE SODIUM SUCC 125 MG IJ SOLR
INTRAMUSCULAR | Status: AC
  Filled 2017-11-14: qty 2

## 2017-11-14 MED ORDER — DIPHENHYDRAMINE HCL 50 MG/ML IJ SOLN
50.0000 mg | Freq: Once | INTRAMUSCULAR | Status: AC
  Administered 2017-11-14: 50 mg via INTRAVENOUS

## 2017-11-14 MED ORDER — EPINEPHRINE 0.3 MG/0.3ML IJ SOAJ: 0.3000 mg | Freq: Once | INTRAMUSCULAR | 0 refills | Status: AC

## 2017-11-14 MED ORDER — DIPHENHYDRAMINE HCL 50 MG/ML IJ SOLN
INTRAMUSCULAR | Status: AC
  Filled 2017-11-14: qty 1

## 2017-11-14 NOTE — Discharge Instructions (Signed)
Please seek medical attention for any high fevers, chest pain, shortness of breath, change in behavior, persistent vomiting, bloody stool or any other new or concerning symptoms.  

## 2017-11-14 NOTE — ED Triage Notes (Signed)
Pt comes into the ED via POV c/o allergic reaction after taking aspirin about an hour ago.  Patient states she has never taken it before but was taking it for a headache.  Patient presents with bilateral eye swelling, scratchy throat, coughing, and feels short of breath.  Patient brought straight to exam room for IV placement and cardiac monitoring.

## 2017-11-14 NOTE — ED Provider Notes (Signed)
[facility] Emergency Department Provider Note   ____________________________________________   I have reviewed the triage vital signs and the nursing notes.   HISTORY  Chief Complaint Allergic Reaction   History limited by: Not Limited   HPI Zoe Maldonado is a 27 y.o. female who presents to the emergency department today because of concerns for possible allergic reaction.  Patient states that she thinks she might be having a reaction to aspirin.  She cannot recall taking aspirin in the past although took one about an hour ago for headache.  About 30 minutes after that she started noticing some itchiness to her throat.  She then noticed some swelling and itchiness to her eyes.  The swelling has progressed.  She did take a Claritin.  She denies allergic reactions in the past.  She denies any nausea or vomiting.   Per medical record review patient has a history of asthma as a child.   Past Medical History:  Diagnosis Date  . Asthma    as a child    Patient Active Problem List   Diagnosis Date Noted  . Perianal abscess     Past Surgical History:  Procedure Laterality Date  . Nexplanon  2016    Prior to Admission medications   Medication Sig Start Date End Date Taking? Authorizing Provider  Norethindrone-Ethinyl Estradiol-Fe Biphas (LO LOESTRIN FE) 1 MG-10 MCG / 10 MCG tablet Take 1 tablet by mouth daily. 05/07/17 07/30/17  Vena AustriaStaebler, Andreas, MD    Allergies Aspirin and Latex  No family history on file.  Social History Social History   Tobacco Use  . Smoking status: Former Games developermoker  . Smokeless tobacco: Never Used  Substance Use Topics  . Alcohol use: Yes    Comment: Rarely  . Drug use: Yes    Types: Marijuana    Review of Systems Constitutional: No fever/chills Eyes: Positive for eye swelling.  ENT: Positive for itchy throat.  Cardiovascular: Denies chest pain. Respiratory: Denies shortness of breath. Gastrointestinal: No  abdominal pain.  No nausea, no vomiting.  No diarrhea.   Genitourinary: Negative for dysuria. Musculoskeletal: Negative for back pain. Skin: Negative for rash. Neurological: Negative for headaches, focal weakness or numbness.  ____________________________________________   PHYSICAL EXAM:  VITAL SIGNS: ED Triage Vitals  Enc Vitals Group     BP 11/14/17 2147 122/72     Pulse Rate 11/14/17 2147 77     Resp 11/14/17 2147 (!) 21     Temp 11/14/17 2147 97.9 F (36.6 C)     Temp Source 11/14/17 2147 Oral     SpO2 11/14/17 2147 100 %     Weight 11/14/17 2148 132 lb (59.9 kg)     Height 11/14/17 2148 5\' 3"  (1.6 m)     Head Circumference --      Peak Flow --      Pain Score 11/14/17 2148 7   Constitutional: Alert and oriented.  Eyes: Conjunctivae are normal. Significant periorbital swelling.  ENT      Head: Normocephalic and atraumatic.      Nose: No congestion/rhinnorhea.      Mouth/Throat: Mucous membranes are moist.      Neck: No stridor. Hematological/Lymphatic/Immunilogical: No cervical lymphadenopathy. Cardiovascular: Normal rate, regular rhythm.  No murmurs, rubs, or gallops.  Respiratory: Normal respiratory effort without tachypnea nor retractions. Breath sounds are clear and equal bilaterally. No wheezes/rales/rhonchi. Gastrointestinal: Soft and non tender. No rebound. No guarding.  Genitourinary: Deferred Musculoskeletal: Normal range of motion in all extremities.  No lower extremity edema. Neurologic:  Normal speech and language. No gross focal neurologic deficits are appreciated.  Skin:  Skin is warm, dry and intact. No rash noted. No hives.  Psychiatric: Mood and affect are normal. Speech and behavior are normal. Patient exhibits appropriate insight and judgment.  ____________________________________________    LABS (pertinent positives/negatives)  BMP wnl except glu 101 CBC  wnl  ____________________________________________   EKG  None  ____________________________________________    RADIOLOGY  None  ____________________________________________   PROCEDURES  Procedures  ____________________________________________   INITIAL IMPRESSION / ASSESSMENT AND PLAN / ED COURSE  Pertinent labs & imaging results that were available during my care of the patient were reviewed by me and considered in my medical decision making (see chart for details).   Patient presented to the emergency department today because of concern for allergic reaction.  Patient does have some swelling to her eyelids.  No obvious hives identified.  Patient not in any respiratory distress.  Will give patient Benadryl and Solu-Medrol.  She Artie took Claritin.  Will plan on watching in the emergency department.  No erythema to suggest periorbital cellulitis.  ____________________________________________   FINAL CLINICAL IMPRESSION(S) / ED DIAGNOSES  Final diagnoses:  Allergic reaction, initial encounter     Note: This dictation was prepared with Dragon dictation. Any transcriptional errors that result from this process are unintentional     Phineas Semen, MD 11/14/17 2258

## 2017-11-14 NOTE — ED Notes (Signed)
Patient up to use restroom 

## 2017-11-15 MED ORDER — FAMOTIDINE IN NACL 20-0.9 MG/50ML-% IV SOLN
INTRAVENOUS | Status: AC
  Filled 2017-11-15: qty 50

## 2017-11-15 MED ORDER — FAMOTIDINE IN NACL 20-0.9 MG/50ML-% IV SOLN
20.0000 mg | Freq: Once | INTRAVENOUS | Status: AC
  Administered 2017-11-15: 20 mg via INTRAVENOUS

## 2017-11-15 NOTE — ED Provider Notes (Signed)
-----------------------------------------   2:19 AM on 11/15/2017 -----------------------------------------  Patient is looking better.  Right eye swelling is down considerably, mild decrease in swelling of the left eye patient is now able to open her left eye.  Discussed with the patient did not use aspirin products in the future also discussed that all NSAIDs are related to aspirin.  We will discharge with an EpiPen and prednisone.  We will have the patient follow-up with her doctor.  I discussed return precautions for difficulty breathing or sensation of swelling.  I also discussed when and how to use an EpiPen.   Minna AntisPaduchowski, Nicco Reaume, MD 11/15/17 [id number]

## 2018-04-07 IMAGING — CT CT PELVIS W/ CM
2 of 3 series · 16 of 46 positions shown, 18 images · IV contrast (APPLIED)
Comparison: None.

CLINICAL DATA: Perineal abscess.

EXAM:
CT PELVIS WITH CONTRAST
TECHNIQUE: Multidetector CT imaging of the pelvis was performed using the
standard protocol following the bolus administration of intravenous
contrast.
CONTRAST:  100mL A642CN-O99 IOPAMIDOL (A642CN-O99) INJECTION 61%

[Series 2: routine pel with · axial · 0.96mm/px · z∈[-461,-191]mm · 13 of 62 slices shown, 15 images]
[im 4/62  soft-tissue]
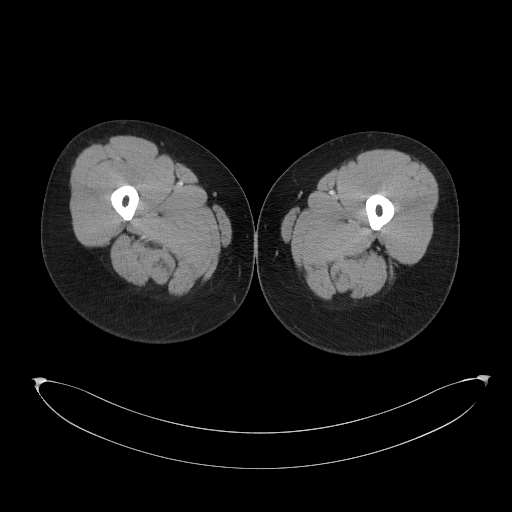
[im 4/62  bone]
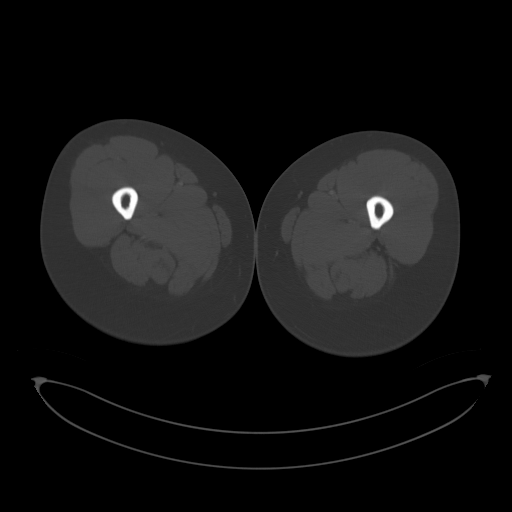
[im 8/62  soft-tissue]
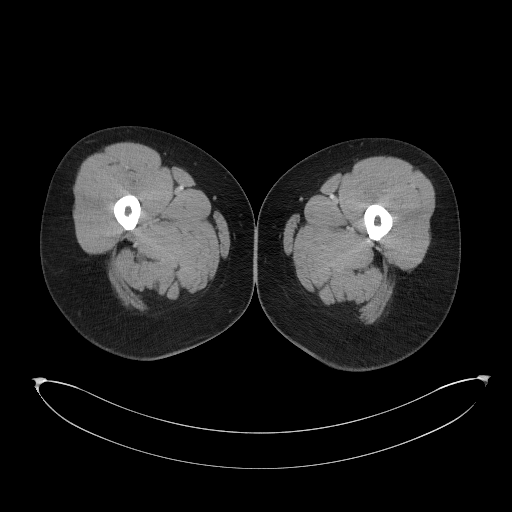
[im 12/62  soft-tissue]
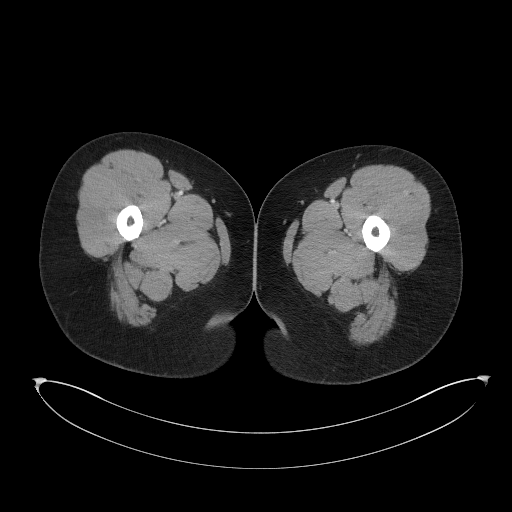
[im 18/62  soft-tissue]
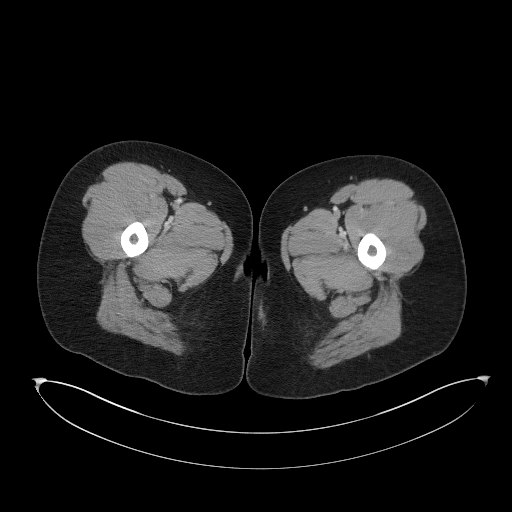
[im 22/62  soft-tissue]
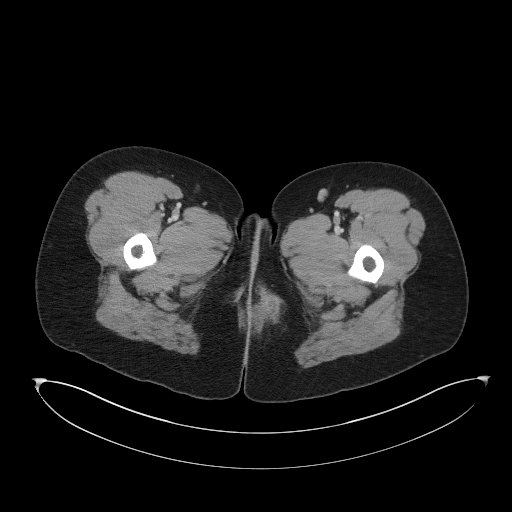
[im 26/62  soft-tissue]
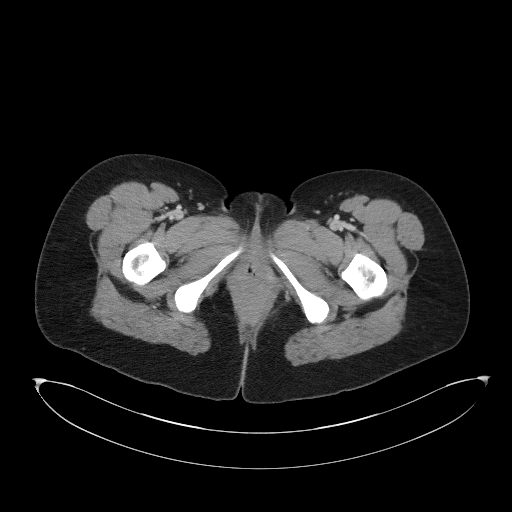
[im 32/62  soft-tissue]
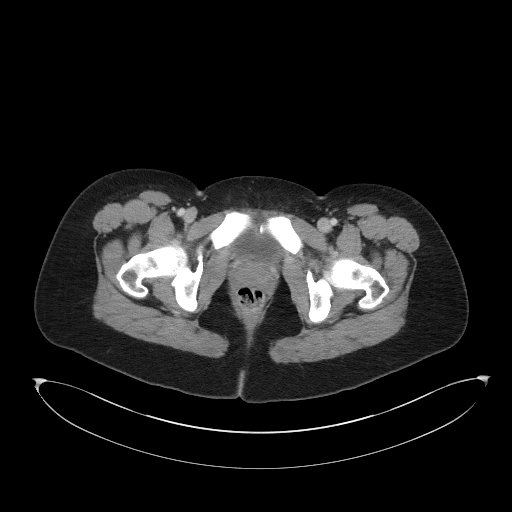
[im 36/62  soft-tissue]
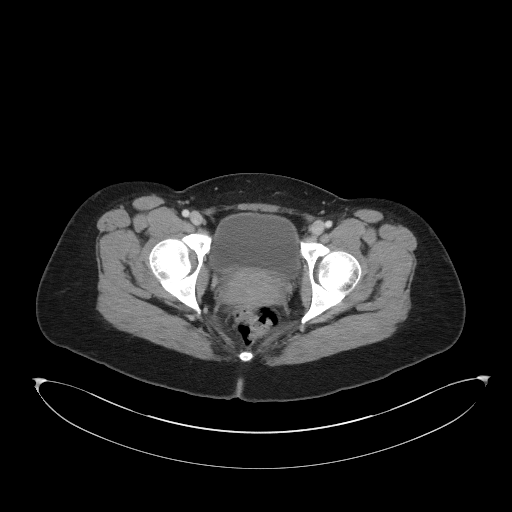
[im 40/62  soft-tissue]
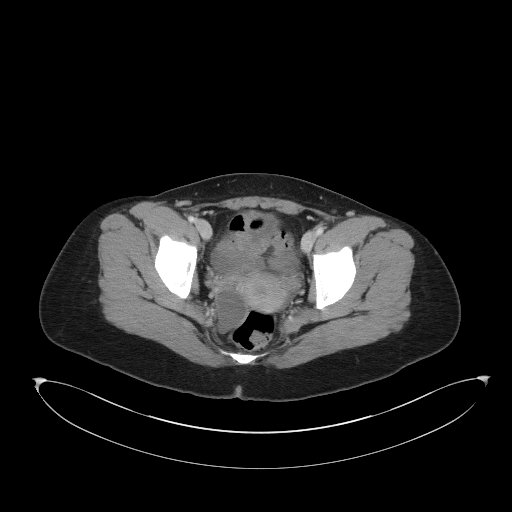
[im 40/62  bone]
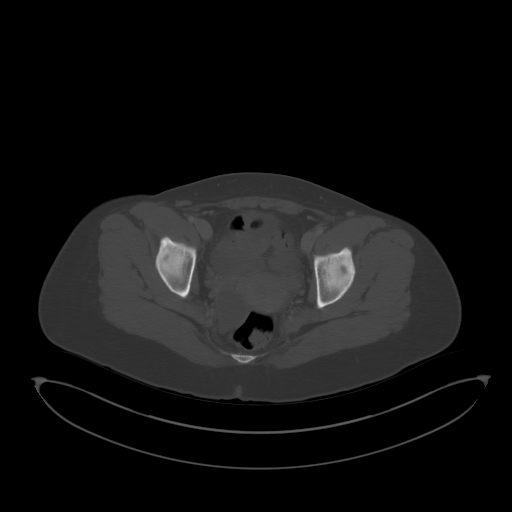
[im 44/62  soft-tissue]
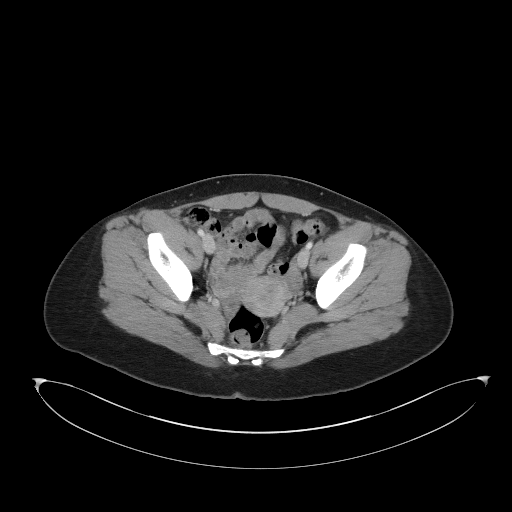
[im 50/62  soft-tissue]
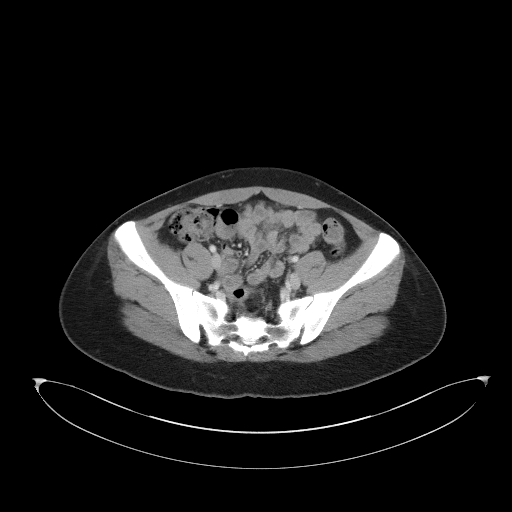
[im 54/62  soft-tissue]
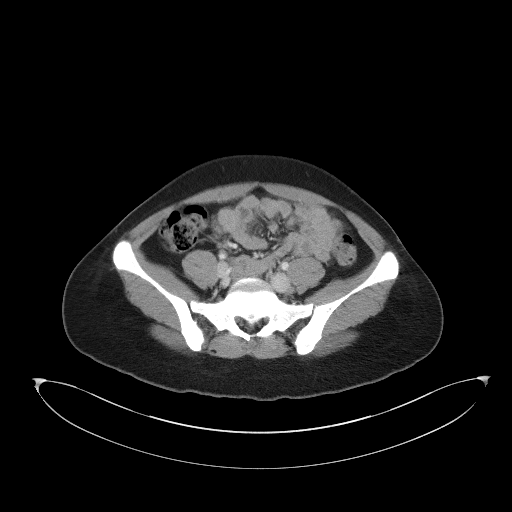
[im 58/62  soft-tissue]
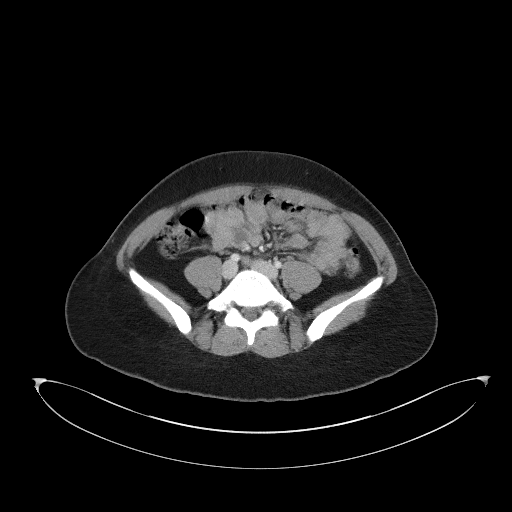

[Series 4: coronal st · coronal · 0.61mm/px · 3 of 73 slices shown]
[im 25/73  soft-tissue]
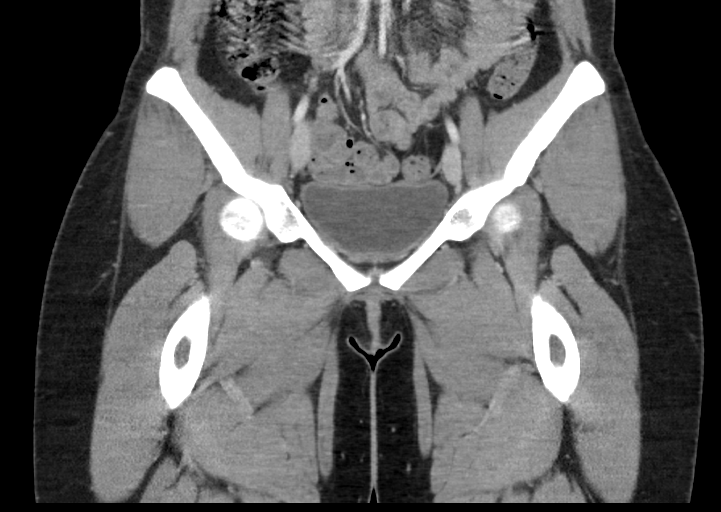
[im 33/73  soft-tissue]
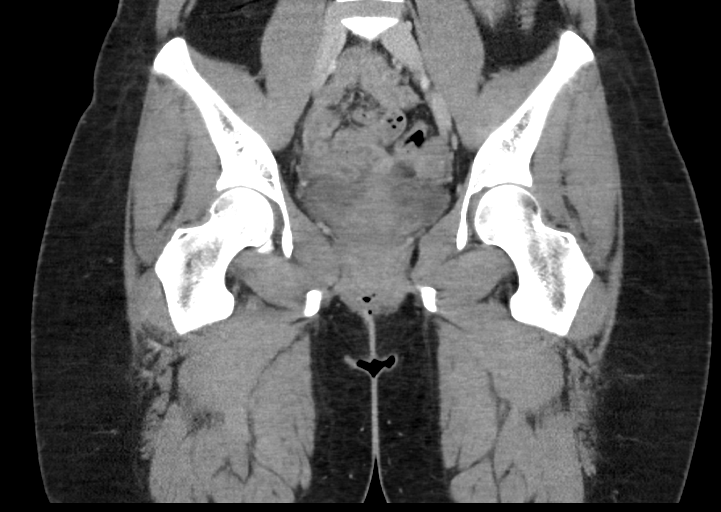
[im 41/73  soft-tissue]
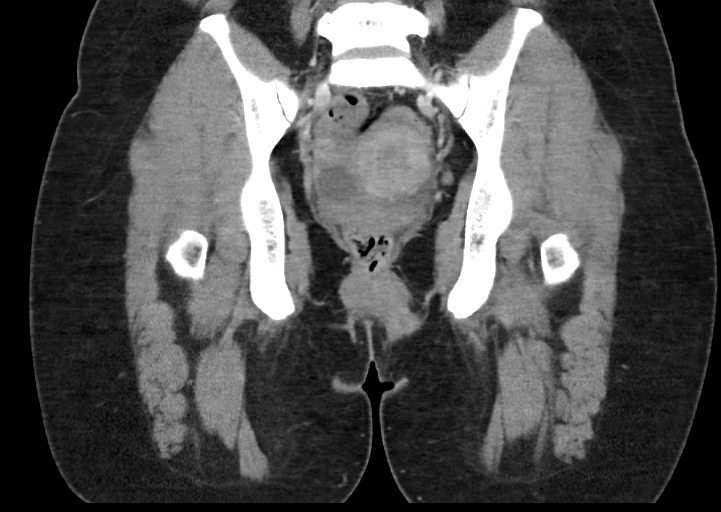

[16 of 46 positions shown; findings below may reference images not displayed]

FINDINGS: Urinary Tract: Visualized distal ureters and urinary bladder are
normal.

Bowel: Normal visualize colon and small bowel. At the left posterior
aspect of the anal opening, there is a bilobed collection that
measures 2.2 x 1.1 cm in. There is associated surrounding
inflammation.

Vascular/Lymphatic: No pathologically enlarged lymph nodes. No
significant vascular abnormality seen.

Reproductive:  Normal uterus and ovaries.

Other:  None

Musculoskeletal: Normal
IMPRESSION: Small, 2.2 x 1.1 cm abscess of the left posterior aspect of the anal
opening. Surrounding inflammatory change limits assessment for sinus
tract. If there is concern for fistula Coleman, Leonie
pelvic MRI may be helpful.

## 2020-06-10 NOTE — L&D Delivery Note (Signed)
Delivery Note At 9:32 AM a viable female was delivered via Vaginal, Spontaneous (Presentation: ROA ).  APGAR: 8, 9; weight 7 lb 15.3 oz (3610 g).   Placenta status: Spontaneous, Intact.  Cord: 3 vessels with the following complications: None.  Cord pH: N/A  Anesthesia: Epidural Episiotomy: None Lacerations: 2nd degree;Perineal Suture Repair: 3.0 monocryl Est. Blood Loss (mL): 330  Mom to postpartum.  Baby to Couplet care / Skin to Skin.  Vena [place]

## 2020-06-28 ENCOUNTER — Ambulatory Visit (INDEPENDENT_AMBULATORY_CARE_PROVIDER_SITE_OTHER): Payer: 59

## 2020-06-28 ENCOUNTER — Ambulatory Visit (INDEPENDENT_AMBULATORY_CARE_PROVIDER_SITE_OTHER): Payer: 59 | Admitting: Podiatry

## 2020-06-28 ENCOUNTER — Encounter: Payer: Self-pay | Admitting: Podiatry

## 2020-06-28 ENCOUNTER — Other Ambulatory Visit: Payer: Self-pay

## 2020-06-28 DIAGNOSIS — M67471 Ganglion, right ankle and foot: Secondary | ICD-10-CM

## 2020-06-28 DIAGNOSIS — M898X9 Other specified disorders of bone, unspecified site: Secondary | ICD-10-CM | POA: Diagnosis not present

## 2020-06-28 NOTE — Progress Notes (Signed)
  Subjective:  Patient ID: Zoe Maldonado, female    DOB: [birth year],  MRN: [id number] HPI Chief Complaint  Patient presents with  . Ankle Pain    Lateral ankle right - cyst x years, gotten bigger and more painful especially with shoes (rubbing)  . New Patient (Initial Visit)    30 y.o. female presents with the above complaint.   ROS: She denies fever chills nausea vomiting muscle aches pains calf pain back pain chest pain shortness of breath.  Past Medical History:  Diagnosis Date  . Asthma    as a child   Past Surgical History:  Procedure Laterality Date  . Nexplanon  2016    Current Outpatient Medications:  .  AMOXICILLIN PO, Take by mouth., Disp: , Rfl:   Allergies  Allergen Reactions  . Aspirin Swelling  . Latex    Review of Systems Objective:  There were no vitals filed for this visit.  General: Well developed, nourished, in no acute distress, alert and oriented x3   Dermatological: Skin is warm, dry and supple bilateral. Nails x 10 are well maintained; remaining integument appears unremarkable at this time. There are no open sores, no preulcerative lesions, no rash or signs of infection present.  Vascular: Dorsalis Pedis artery and Posterior Tibial artery pedal pulses are 2/4 bilateral with immedate capillary fill time. Pedal hair growth present. No varicosities and no lower extremity edema present bilateral.   Neruologic: Grossly intact via light touch bilateral. Vibratory intact via tuning fork bilateral. Protective threshold with Semmes Wienstein monofilament intact to all pedal sites bilateral. Patellar and Achilles deep tendon reflexes 2+ bilateral. No Babinski or clonus noted bilateral.   Musculoskeletal: No gross boney pedal deformities bilateral. No pain, crepitus, or limitation noted with foot and ankle range of motion bilateral. Muscular strength 5/5 in all groups tested bilateral.  Painful nodule just beneath the lateral malleolus overlying the  peroneal tubercle measures approximately 1.2 cm in diameter and is firm nonpulsatile.  More than likely a consolidated ganglion cyst.  Gait: Unassisted, Nonantalgic.    Radiographs:  Radiographs taken today do demonstrate a hypertrophic peroneal tubercle to the lateral aspect of the right foot calcaneus.  Overlying soft tissue masses present.  Most likely ganglion associated with peroneal tendons.  Assessment & Plan:   Assessment: Hypertrophic peroneal tubercle right with ganglion cyst that appears to be consolidated.  Plan: Discussed etiology pathology conservative surgical therapies and placed local anesthetic around the cyst.  I much sterile Betadine skin prep I attempted to decrease the bulk of the cyst by aspiration however it was consolidated.  I was unable to aspirate at all however I was able to inject into the cyst itself some Kenalog so I did that 20 mg of Kenalog into the cyst itself.  We did discuss having her back for surgical intervention.     Tamsyn Owusu [place], [place]

## 2020-07-10 ENCOUNTER — Ambulatory Visit: Payer: Self-pay | Admitting: Podiatry

## 2020-07-12 ENCOUNTER — Ambulatory Visit: Payer: Self-pay | Admitting: Podiatry

## 2020-07-24 ENCOUNTER — Other Ambulatory Visit: Payer: Self-pay

## 2020-07-24 ENCOUNTER — Ambulatory Visit (INDEPENDENT_AMBULATORY_CARE_PROVIDER_SITE_OTHER): Payer: 59 | Admitting: Podiatry

## 2020-07-24 DIAGNOSIS — M67471 Ganglion, right ankle and foot: Secondary | ICD-10-CM | POA: Diagnosis not present

## 2020-07-24 DIAGNOSIS — M898X9 Other specified disorders of bone, unspecified site: Secondary | ICD-10-CM | POA: Diagnosis not present

## 2020-07-24 NOTE — Patient Instructions (Signed)
Pre-Operative Instructions  Congratulations, you have decided to take an important step towards improving your quality of life.  You can be assured that the doctors and staff at [facility] will be with you every step of the way.  Here are some important things you should know:  1. Plan to be at the surgery center/hospital at least 1 (one) hour prior to your scheduled time, unless otherwise directed by the surgical center/hospital staff.  You must have a responsible adult accompany you, remain during the surgery and drive you home.  Make sure you have directions to the surgical center/hospital to ensure you arrive on time. 2. If you are having surgery at [facility] or [facility], you will need a copy of your medical history and physical form from your family physician within one month prior to the date of surgery. We will give you a form for your primary physician to complete.  3. We make every effort to accommodate the date you request for surgery.  However, there are times where surgery dates or times have to be moved.  We will contact you as soon as possible if a change in schedule is required.   4. No aspirin/ibuprofen for one week before surgery.  If you are on aspirin, any non-steroidal anti-inflammatory medications (Mobic, Aleve, Ibuprofen) should not be taken seven (7) days prior to your surgery.  You make take Tylenol for pain prior to surgery.  5. Medications - If you are taking daily heart and blood pressure medications, seizure, reflux, allergy, asthma, anxiety, pain or diabetes medications, make sure you notify the surgery center/hospital before the day of surgery so they can tell you which medications you should take or avoid the day of surgery. 6. No food or drink after midnight the night before surgery unless directed otherwise by surgical center/hospital staff. 7. No alcoholic beverages 24-hours prior to surgery.  No smoking 24-hours prior or 24-hours after  surgery. 8. Wear loose pants or shorts. They should be loose enough to fit over bandages, boots, and casts. 9. Don't wear slip-on shoes. Sneakers are preferred. 10. Bring your boot with you to the surgery center/hospital.  Also bring crutches or a walker if your physician has prescribed it for you.  If you do not have this equipment, it will be provided for you after surgery. 11. If you have not been contacted by the surgery center/hospital by the day before your surgery, call to confirm the date and time of your surgery. 12. Leave-time from work may vary depending on the type of surgery you have.  Appropriate arrangements should be made prior to surgery with your employer. 13. Prescriptions will be provided immediately following surgery by your doctor.  Fill these as soon as possible after surgery and take the medication as directed. Pain medications will not be refilled on weekends and must be approved by the doctor. 14. Remove nail polish on the operative foot and avoid getting pedicures prior to surgery. 15. Wash the night before surgery.  The night before surgery wash the foot and leg well with water and the antibacterial soap provided. Be sure to pay special attention to beneath the toenails and in between the toes.  Wash for at least three (3) minutes. Rinse thoroughly with water and dry well with a towel.  Perform this wash unless told not to do so by your physician.  Enclosed: 1 Ice pack (please put in freezer the night before surgery)   1 Hibiclens skin cleaner     Pre-op instructions  If you have any questions regarding the instructions, please do not hesitate to call our office.  [place]: [address], [place], [place] [address] -- [phone]  [place]: [address] [place], [place] [address] -- [phone]  [place]: [address], [place], [place] [address] -- [phone]   Website: [email or web address] 

## 2020-07-24 NOTE — Progress Notes (Signed)
She presents today for surgical consult regarding her ganglion cyst on her right foot.  She denies fever chills nausea vomiting any changes in her past medical history medications and allergies states that I feel that this is got bigger so I would like to try to have it removed.  Objective: Vital signs are stable alert oriented x3 ganglion cyst measuring greater than 1 cm diameter overlying her peroneal tubercle of her right calcaneus.  This is tender on palpation.  Assessment: Last time she was in we were unable to aspirate it so surgery is going to be her best bet ganglion cyst with an exostosis of the calcaneus is the diagnosis.  Plan: Discussed etiology pathology conservative surgical therapies at this point we consented her for an excision of ganglion cyst right foot.  Also consented her for an exostectomy peroneal tubercle right foot.  She understands this and is amenable to it signed a 3 page of the consent form we also discussed surgery center as well as anesthesia.  She was given both oral and written home-going instruction for the care and preop orders I will follow-up with her in a couple of weeks.

## 2020-08-07 ENCOUNTER — Telehealth: Payer: Self-pay | Admitting: Podiatry

## 2020-08-07 NOTE — Telephone Encounter (Addendum)
DOS: 08/18/2020  Procedures: Exc. Ganglion/Tumor Rt [phone]) & Calcaneal Exostectomy Rt [phone])  [facility] Effective 05/10/2020 -  Deductible: $ Out of Pocket: $ CoInsurance: Copay: $  Per Flint Melter P patient DOES NOT HAVE COVERAGE for surgeon or surgery. Patient can call and update their insurance plan to have coverage. Call Ref # [id number]

## 2020-08-23 ENCOUNTER — Encounter: Payer: 59 | Admitting: Podiatry

## 2020-09-04 ENCOUNTER — Encounter: Payer: 59 | Admitting: Podiatry

## 2020-09-04 ENCOUNTER — Telehealth: Payer: Self-pay

## 2020-09-04 NOTE — Telephone Encounter (Signed)
Zoe Maldonado called to cancel her surgery with Dr. Al Corpus on 09/29/2020. She just found out that she is pregnant. Notified Dr. Al Corpus and Aram Beecham with [facility]

## 2020-09-18 ENCOUNTER — Other Ambulatory Visit: Payer: Self-pay

## 2020-09-18 ENCOUNTER — Encounter: Payer: Self-pay | Admitting: Obstetrics and Gynecology

## 2020-09-18 ENCOUNTER — Encounter: Payer: 59 | Admitting: Podiatry

## 2020-09-18 ENCOUNTER — Ambulatory Visit (INDEPENDENT_AMBULATORY_CARE_PROVIDER_SITE_OTHER): Payer: 59 | Admitting: Obstetrics and Gynecology

## 2020-09-18 ENCOUNTER — Other Ambulatory Visit (HOSPITAL_COMMUNITY)
Admission: RE | Admit: 2020-09-18 | Discharge: 2020-09-18 | Disposition: A | Discharge status: Home / Self Care | Payer: Self-pay | Source: Ambulatory Visit | Attending: Obstetrics and Gynecology | Admitting: Obstetrics and Gynecology

## 2020-09-18 VITALS — BP 100/60 | HR 83 | Wt 149.0 lb

## 2020-09-18 DIAGNOSIS — N926 Irregular menstruation, unspecified: Secondary | ICD-10-CM

## 2020-09-18 DIAGNOSIS — Z113 Encounter for screening for infections with a predominantly sexual mode of transmission: Secondary | ICD-10-CM | POA: Insufficient documentation

## 2020-09-18 DIAGNOSIS — Z3689 Encounter for other specified antenatal screening: Secondary | ICD-10-CM

## 2020-09-18 DIAGNOSIS — Z3400 Encounter for supervision of normal first pregnancy, unspecified trimester: Secondary | ICD-10-CM

## 2020-09-18 DIAGNOSIS — Z124 Encounter for screening for malignant neoplasm of cervix: Secondary | ICD-10-CM

## 2020-09-18 DIAGNOSIS — Z369 Encounter for antenatal screening, unspecified: Secondary | ICD-10-CM

## 2020-09-18 NOTE — Progress Notes (Signed)
New Obstetric Patient H&P    Chief Complaint: "Desires prenatal care"   History of Present Illness: Patient is a 30 y.o. G1P0000 Not Hispanic or Latino female, presents with amenorrhea and positive home pregnancy test. Patient's last menstrual period was 07/23/2020. and based on her  LMP, her Estimated Date of Delivery: 04/29/21  and her EGA is [redacted]w[redacted]d. Cycles are regular monthly.  Her last menstrual period was normal Since her LMP she claims she has experienced fatigue, no nausea, some breast tenderness. She denies vaginal bleeding. Her past medical history is contibutory for history of anal fistula.  Since her LMP, she admits to the use of tobacco products  no There are cats in the home in the home  no  She admits close contact with children on a regular basis  no  She has had chicken pox in the past yes She has had Tuberculosis exposures, symptoms, or previously tested positive for TB   no Current or past history of domestic violence. no  Genetic Screening/Teratology Counseling: (Includes patient, baby's father, or anyone in either family with:)   1. Patient's age >/= 26 at [facility]  no 2. Thalassemia ([place], [place], Mediterranean, or Asian background): MCV<80  no 3. Neural tube defect (meningomyelocele, spina bifida, anencephaly)  no 4. Congenital heart defect  no  5. Down syndrome  no 6. Tay-Sachs (Jewish, [place]  no 7. Canavan's Disease  no 8. Sickle cell disease or trait (African)  no  9. Hemophilia or other blood disorders  no  10. Muscular dystrophy  no  11. Cystic fibrosis  no  12. Huntington's Chorea  no  13. Mental retardation/autism  no 14. Other inherited genetic or chromosomal disorder  no 15. Maternal metabolic disorder (DM, PKU, etc)  no 16. Patient or FOB with a child with a birth defect not listed above no  16a. Patient or FOB with a birth defect themselves no 17. Recurrent pregnancy loss, or stillbirth  no  18. Any medications since LMP other than  prenatal vitamins (include vitamins, supplements, OTC meds, drugs, alcohol)  no 19. Any other genetic/environmental exposure to discuss  no  Infection History:   1. Lives with someone with TB or TB exposed  no  2. Patient or partner has history of genital herpes  no 3. Rash or viral illness since LMP  no 4. History of STI (GC, CT, HPV, syphilis, HIV)  no 5. History of recent travel :  no  Other pertinent information:  no     Review of Systems:10 point review of systems negative unless otherwise noted in HPI  Past Medical History:  Patient Active Problem List   Diagnosis Date Noted  . 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 Perianal abscess     Past Surgical History:  Past Surgical History:  Procedure Laterality Date  . Nexplanon  2016    Gynecologic History: No LMP recorded. Patient is pregnant.  Obstetric History: G1P0000  Family History:  No family history on file.  Social History:  Social History   Socioeconomic History  . Marital status: Married    Spouse name: Not on file  . Number of children: Not on file  . Years of education: Not on file  . Highest education level: Not on file  Occupational History  . Not on file  Tobacco Use  . Smoking status: Former Games developer  . Smokeless tobacco: Never Used  Vaping Use  . Vaping Use: Never used  Substance and Sexual Activity  . Alcohol use:  Yes    Comment: Rarely  . Drug use: Yes    Types: Marijuana  . Sexual activity: Yes    Birth control/protection: Implant  Other Topics Concern  . Not on file  Social History Narrative  . Not on file   Social Determinants of Health   Financial Resource Strain: Not on file  Food Insecurity: Not on file  Transportation Needs: Not on file  Physical Activity: Not on file  Stress: Not on file  Social Connections: Not on file  Intimate Partner Violence: Not on file    Allergies:  Allergies  Allergen Reactions  . Aspirin Swelling  . Latex     Medications: Prior to Admission  medications   Medication Sig Start Date End Date Taking? Authorizing Provider  Prenatal Vit-Fe Fumarate-FA (PRENATAL MULTIVITAMIN) TABS tablet Take 1 tablet by mouth daily at 12 noon.   Yes [provider]  AMOXICILLIN PO Take by mouth.    [provider]    Physical Exam Vitals: There were no vitals taken for this visit.  General: NAD HEENT: normocephalic, anicteric Thyroid: no enlargement, no palpable nodules Pulmonary: No increased work of breathing, CTAB Cardiovascular: RRR, distal pulses 2+ Abdomen: soft, non-tender, non-distended.  Umbilicus without lesions.  No hepatomegaly, splenomegaly or masses palpable. No evidence of hernia  Genitourinary:  External: Normal external female genitalia.  Normal urethral meatus, normal  Bartholin's and Skene's glands.    Vagina: Normal vaginal mucosa, no evidence of prolapse.    Cervix: Grossly normal in appearance, no bleeding  Uterus: Non-enlarged, mobile, normal contour.  No CMT  Adnexa: ovaries non-enlarged, no adnexal masses  Rectal: deferred Extremities: no edema, erythema, or tenderness Neurologic: Grossly intact Psychiatric: mood appropriate, affect full   Assessment: 30 y.o. G1P0000 at [redacted]w[redacted]d presenting to initiate prenatal care  Plan: 1) Avoid alcoholic beverages. 2) Patient encouraged not to smoke.  3) Discontinue the use of all non-medicinal drugs and chemicals.  4) Take prenatal vitamins daily.  5) Nutrition, food safety (fish, cheese advisories, and high nitrite foods) and exercise discussed. 6) Hospital and practice style discussed with cross coverage system.  7) Genetic Screening, such as with 1st Trimester Screening, cell free fetal DNA, AFP testing, and Ultrasound, as well as with amniocentesis and CVS as appropriate, is discussed with patient. At the conclusion of today's visit patient requested genetic testing 8) Dating scan next visit 9) History of anal fistula - did not discuss but may impact mode of  delivery  Vena [place], MD, [facility], [facility] 09/18/2020, 2:36 PM

## 2020-09-19 LAB — RPR+RH+ABO+RUB AB+AB SCR+CB...
Antibody Screen: NEGATIVE
HIV Screen 4th Generation wRfx: NONREACTIVE
Hematocrit: 40.4 % (ref 34.0–46.6)
Hemoglobin: 13.4 g/dL (ref 11.1–15.9)
Hepatitis B Surface Ag: NEGATIVE
MCH: 31.3 pg (ref 26.6–33.0)
MCHC: 33.2 g/dL (ref 31.5–35.7)
MCV: 94 fL (ref 79–97)
Platelets: 397 10*3/uL (ref 150–450)
RBC: 4.28 x10E6/uL (ref 3.77–5.28)
RDW: 12 % (ref 11.7–15.4)
RPR Ser Ql: NONREACTIVE
Rh Factor: POSITIVE
Rubella Antibodies, IGG: 7.37 index (ref >0.99)
Varicella zoster IgG: 299 index (ref >165)
WBC: 8 10*3/uL (ref 3.4–10.8)

## 2020-09-21 LAB — CYTOLOGY - PAP
Chlamydia: NEGATIVE
Comment: NEGATIVE
Comment: NEGATIVE
Comment: NORMAL
Diagnosis: NEGATIVE
High risk HPV: NEGATIVE
Neisseria Gonorrhea: NEGATIVE

## 2020-09-22 LAB — URINE CULTURE

## 2020-10-02 ENCOUNTER — Ambulatory Visit (INDEPENDENT_AMBULATORY_CARE_PROVIDER_SITE_OTHER): Payer: 59 | Admitting: Obstetrics and Gynecology

## 2020-10-02 ENCOUNTER — Encounter: Payer: Self-pay | Admitting: Obstetrics and Gynecology

## 2020-10-02 ENCOUNTER — Other Ambulatory Visit: Payer: Self-pay

## 2020-10-02 VITALS — BP 118/74 | Wt 152.0 lb

## 2020-10-02 DIAGNOSIS — Z3A10 10 weeks gestation of pregnancy: Secondary | ICD-10-CM | POA: Diagnosis not present

## 2020-10-02 DIAGNOSIS — Z3401 Encounter for supervision of normal first pregnancy, first trimester: Secondary | ICD-10-CM | POA: Diagnosis not present

## 2020-10-02 NOTE — Progress Notes (Signed)
  Routine Prenatal Care Visit  Subjective  Zoe Maldonado is a 30 y.o. G1P0000 at [redacted]w[redacted]d being seen today for ongoing prenatal care.  She is currently monitored for the following issues for this low-risk pregnancy and has Perianal abscess; Fistula, anal; and Supervision of low-risk first pregnancy on their problem list.  ----------------------------------------------------------------------------------- Patient reports no complaints.    . Vag. Bleeding: None.   . Leaking Fluid denies.  Pelvic u/s confirms EDD today (See PROGRESS NOTE from 10/02/20 for details) ----------------------------------------------------------------------------------- The following portions of the patient's history were reviewed and updated as appropriate: allergies, current medications, past family history, past medical history, past social history, past surgical history and problem list. Problem list updated.  Objective  Blood pressure 118/74, weight 152 lb (68.9 kg), last menstrual period 07/23/2020. Pregravid weight 154 lb (69.9 kg) Total Weight Gain -2 lb (-0.907 kg) Urinalysis: Urine Protein    Urine Glucose    Fetal Status: Fetal Heart Rate (bpm): 173 ([place])         General:  Alert, oriented and cooperative. Patient is in no acute distress.  Skin: Skin is warm and dry. No rash noted.   Cardiovascular: Normal heart rate noted  Respiratory: Normal respiratory effort, no problems with respiration noted  Abdomen: Soft, gravid, appropriate for gestational age.       Pelvic:  Cervical exam deferred        Extremities: Normal range of motion.     Mental Status: Normal mood and affect. Normal behavior. Normal judgment and thought content.   Assessment   30 y.o. G1P0000 at [redacted]w[redacted]d by  04/29/2021, by Last Menstrual Period presenting for routine prenatal visit  Plan   Pregnancy 1 Problems (from 09/18/20 to present)    Problem Noted Resolved   Supervision of low-risk first pregnancy 10/04/2020 by Conard Novak, MD No   Overview Signed 10/04/2020  8:14 AM by Conard Novak, MD    Clinic [facility] Prenatal Labs  Dating L=10 Blood type: O/Positive/-- (04/11 1529)   Genetic Screen 1 Screen:    AFP:     Quad:     NIPS: Antibody:Negative (04/11 1529)  Anatomic [place]  Rubella: 7.37 (04/11 1529) Varicella: @VZVIGG @  GTT Early:               Third trimester:  RPR: Non Reactive (04/11 1529)   Rhogam  HBsAg: Negative (04/11 1529)   TDaP vaccine                       Flu Shot: HIV: Non Reactive (04/11 1529)   Baby Food                                GBS:   Contraception  Pap:  CBB     CS/VBAC    Support Person                Preterm labor symptoms and general obstetric precautions including but not limited to vaginal bleeding, contractions, leaking of fluid and fetal movement were reviewed in detail with the patient. Please refer to After Visit Summary for other counseling recommendations.   Return in about 4 weeks (around 10/30/2020) for Routine Prenatal Appointment.   11/01/2020, MD, Thomasene Mohair [facility], [facility] 10/04/2020 8:15 AM

## 2020-10-04 ENCOUNTER — Encounter: Payer: Self-pay | Admitting: Obstetrics and Gynecology

## 2020-10-04 DIAGNOSIS — Z3400 Encounter for supervision of normal first pregnancy, unspecified trimester: Secondary | ICD-10-CM | POA: Insufficient documentation

## 2020-10-04 NOTE — Progress Notes (Signed)
ULTRASOUND REPORT  Location: [facility] Date of Service: 10/04/2020   Indications:dating Findings:  Mason Jim intrauterine pregnancy is visualized with a CRL consistent with [redacted]w[redacted]d gestation, giving an (U/S) EDD of 05/03/2021. The (U/S) EDD is consistent with the clinically established EDD of 04/29/2021.  FHR: 173 BPM CRL measurement: 27.3 mm Yolk sac is visualized and appears abnormal. Amnion: visualized and appears normal   Right Ovary is not visualized. Left Ovary is not visualized. Corpus luteal cyst:  is not visualized Survey of the adnexa demonstrates no adnexal masses. There is no free peritoneal fluid in the cul de sac.  Impression: 1. [redacted]w[redacted]d Viable Singleton Intrauterine pregnancy by U/S. 2. (U/S) EDD is consistent with Clinically established EDD of 04/29/2021.  I personally performed and interpreted the ultrasound.  Female chaperone present for pelvic exam:   Thomasene Mohair, MD, [facility], [facility] 10/02/2020 5:18 PM

## 2020-10-05 ENCOUNTER — Ambulatory Visit: Payer: Managed Care, Other (non HMO) | Admitting: Family Medicine

## 2020-10-11 ENCOUNTER — Encounter: Payer: 59 | Admitting: Podiatry

## 2020-10-18 ENCOUNTER — Encounter: Payer: 59 | Admitting: Podiatry

## 2020-10-31 ENCOUNTER — Ambulatory Visit (INDEPENDENT_AMBULATORY_CARE_PROVIDER_SITE_OTHER): Payer: 59 | Admitting: Obstetrics and Gynecology

## 2020-10-31 ENCOUNTER — Other Ambulatory Visit: Payer: Self-pay

## 2020-10-31 VITALS — BP 100/62 | Wt 159.0 lb

## 2020-10-31 DIAGNOSIS — Z31438 Encounter for other genetic testing of female for procreative management: Secondary | ICD-10-CM

## 2020-10-31 DIAGNOSIS — Z3401 Encounter for supervision of normal first pregnancy, first trimester: Secondary | ICD-10-CM

## 2020-10-31 DIAGNOSIS — Z369 Encounter for antenatal screening, unspecified: Secondary | ICD-10-CM

## 2020-10-31 DIAGNOSIS — Z1379 Encounter for other screening for genetic and chromosomal anomalies: Secondary | ICD-10-CM

## 2020-10-31 DIAGNOSIS — R81 Glycosuria: Secondary | ICD-10-CM

## 2020-10-31 DIAGNOSIS — Z363 Encounter for antenatal screening for malformations: Secondary | ICD-10-CM

## 2020-10-31 DIAGNOSIS — R7309 Other abnormal glucose: Secondary | ICD-10-CM

## 2020-10-31 DIAGNOSIS — Z3A14 14 weeks gestation of pregnancy: Secondary | ICD-10-CM

## 2020-10-31 LAB — POCT URINALYSIS DIPSTICK OB

## 2020-10-31 NOTE — Progress Notes (Signed)
    Routine Prenatal Care Visit  Subjective  Fauna Roanhorse is a 30 y.o. G1P0000 at [redacted]w[redacted]d being seen today for ongoing prenatal care.  She is currently monitored for the following issues for this low-risk pregnancy and has Perianal abscess; Fistula, anal; and Supervision of low-risk first pregnancy on their problem list.  ----------------------------------------------------------------------------------- Patient reports no complaints.   Contractions: Not present. Vag. Bleeding: None.  Movement: Absent. Denies leaking of fluid.  ----------------------------------------------------------------------------------- The following portions of the patient's history were reviewed and updated as appropriate: allergies, current medications, past family history, past medical history, past social history, past surgical history and problem list. Problem list updated.   Objective  Blood pressure 100/62, weight 159 lb (72.1 kg), last menstrual period 07/23/2020. Pregravid weight 154 lb (69.9 kg) Total Weight Gain 5 lb (2.268 kg) Urinalysis:      Fetal Status: Fetal Heart Rate (bpm): 155   Movement: Absent     General:  Alert, oriented and cooperative. Patient is in no acute distress.  Skin: Skin is warm and dry. No rash noted.   Cardiovascular: Normal heart rate noted  Respiratory: Normal respiratory effort, no problems with respiration noted  Abdomen: Soft, gravid, appropriate for gestational age. Pain/Pressure: Absent     Pelvic:  Cervical exam deferred        Extremities: Normal range of motion.     ental Status: Normal mood and affect. Normal behavior. Normal judgment and thought content.     Assessment   30 y.o. G1P0000 at [redacted]w[redacted]d by  04/29/2021, by Last Menstrual Period presenting for routine prenatal visit  Plan   Pregnancy 1 Problems (from 09/18/20 to present)    Problem Noted Resolved   Supervision of low-risk first pregnancy 10/04/2020 by Conard Novak, MD No   Overview  Signed 10/04/2020  8:14 AM by Conard Novak, MD    Clinic [facility] Prenatal Labs  Dating L=10 Blood type: O/Positive/-- (04/11 1529)   Genetic Screen 1 Screen:    AFP:     Quad:     NIPS: Antibody:Negative (04/11 1529)  Anatomic [place]  Rubella: 7.37 (04/11 1529) Varicella: Immune  GTT Early:               Third trimester:  RPR: Non Reactive (04/11 1529)   Rhogam  HBsAg: Negative (04/11 1529)   TDaP vaccine                       Flu Shot: HIV: Non Reactive (04/11 1529)   Baby Food                                GBS:   Contraception  Pap:09/18/2020 NILM  CBB     CS/VBAC    Support Person                Gestational age appropriate obstetric precautions including but not limited to vaginal bleeding, contractions, leaking of fluid and fetal movement were reviewed in detail with the patient.    1) Elevated urine glucose on dip today, fingerstick BG 156 2-hr postprandial.  Will set up for 3-hr glucose  2) Genetic testing today  Return in about 4 weeks (around 11/28/2020) for 3-hr OGTT next available lab visit. ROB 4 weeks.  Vena Austria, MD, [facility], [facility] 10/31/2020, 3:27 PM

## 2020-11-01 ENCOUNTER — Encounter: Payer: 59 | Admitting: Podiatry

## 2020-11-01 LAB — HEMOGLOBIN A1C
Est. average glucose Bld gHb Est-mCnc: 103 mg/dL
Hgb A1c MFr Bld: 5.2 % (ref 4.8–5.6)

## 2020-11-02 ENCOUNTER — Other Ambulatory Visit: Payer: Self-pay

## 2020-11-02 ENCOUNTER — Other Ambulatory Visit: Payer: 59

## 2020-11-02 ENCOUNTER — Other Ambulatory Visit: Payer: Self-pay | Admitting: Obstetrics and Gynecology

## 2020-11-02 DIAGNOSIS — Z369 Encounter for antenatal screening, unspecified: Secondary | ICD-10-CM

## 2020-11-02 DIAGNOSIS — Z3401 Encounter for supervision of normal first pregnancy, first trimester: Secondary | ICD-10-CM

## 2020-11-02 DIAGNOSIS — R7309 Other abnormal glucose: Secondary | ICD-10-CM

## 2020-11-02 DIAGNOSIS — R81 Glycosuria: Secondary | ICD-10-CM

## 2020-11-02 NOTE — Progress Notes (Signed)
b

## 2020-11-03 LAB — GESTATIONAL GLUCOSE TOLERANCE
Glucose, Fasting: 77 mg/dL (ref 65–94)
Glucose, GTT - 1 Hour: 151 mg/dL (ref 65–179)
Glucose, GTT - 2 Hour: 127 mg/dL (ref 65–154)
Glucose, GTT - 3 Hour: 69 mg/dL (ref 65–139)

## 2020-11-05 LAB — MATERNIT 21 PLUS CORE, BLOOD
Fetal Fraction: 9
Result (T21): NEGATIVE
Trisomy 13 (Patau syndrome): NEGATIVE
Trisomy 18 (Edwards syndrome): NEGATIVE
Trisomy 21 (Down syndrome): NEGATIVE

## 2020-11-11 LAB — INHERITEST CORE(CF97,SMA,FRAX)

## 2020-11-15 ENCOUNTER — Encounter: Payer: 59 | Admitting: Podiatry

## 2020-11-28 ENCOUNTER — Ambulatory Visit (INDEPENDENT_AMBULATORY_CARE_PROVIDER_SITE_OTHER): Payer: 59 | Admitting: Obstetrics and Gynecology

## 2020-11-28 ENCOUNTER — Other Ambulatory Visit: Payer: Self-pay

## 2020-11-28 VITALS — BP 110/62 | Wt 164.0 lb

## 2020-11-28 DIAGNOSIS — Z3402 Encounter for supervision of normal first pregnancy, second trimester: Secondary | ICD-10-CM

## 2020-11-28 DIAGNOSIS — R300 Dysuria: Secondary | ICD-10-CM

## 2020-11-28 DIAGNOSIS — Z3401 Encounter for supervision of normal first pregnancy, first trimester: Secondary | ICD-10-CM

## 2020-11-28 DIAGNOSIS — Z3A18 18 weeks gestation of pregnancy: Secondary | ICD-10-CM

## 2020-11-28 DIAGNOSIS — R002 Palpitations: Secondary | ICD-10-CM

## 2020-11-28 LAB — POCT URINALYSIS DIPSTICK
Bilirubin, UA: NEGATIVE
Blood, UA: NEGATIVE
Glucose, UA: POSITIVE — AB
Ketones, UA: NEGATIVE
Nitrite, UA: NEGATIVE
Protein, UA: NEGATIVE
Spec Grav, UA: 1.02 (ref 1.010–1.025)
Urobilinogen, UA: 0.2 E.U./dL
pH, UA: 6.5 (ref 5.0–8.0)

## 2020-11-28 NOTE — Progress Notes (Signed)
Routine Prenatal Care Visit  Subjective  Zoe Maldonado is a 30 y.o. G1P0000 at [redacted]w[redacted]d being seen today for ongoing prenatal care.  She is currently monitored for the following issues for this low-risk pregnancy and has Perianal abscess; Fistula, anal; and Supervision of low-risk first pregnancy on their problem list.  ----------------------------------------------------------------------------------- Patient reports reports palpitations and occasional chest pain.  Doesn't feel pain is similar to heart burn.  No personal or family cardiac history.  She has checked her heart rate on her watch and did not note tachycardia during the episodes.   Contractions: Not present. Vag. Bleeding: None.  Movement: Present. Denies leaking of fluid.  ----------------------------------------------------------------------------------- The following portions of the patient's history were reviewed and updated as appropriate: allergies, current medications, past family history, past medical history, past social history, past surgical history and problem list. Problem list updated.   Objective  Blood pressure 110/62, weight 164 lb (74.4 kg), last menstrual period 07/23/2020. Pregravid weight 154 lb (69.9 kg) Total Weight Gain 10 lb (4.536 kg) Urinalysis:      Fetal Status: Fetal Heart Rate (bpm): 150   Movement: Present     General:  Alert, oriented and cooperative. Patient is in no acute distress.  Skin: Skin is warm and dry. No rash noted.   Cardiovascular: Normal heart rate noted  Respiratory: Normal respiratory effort, no problems with respiration noted  Abdomen: Soft, gravid, appropriate for gestational age. Pain/Pressure: Absent     Pelvic:  Cervical exam deferred        Extremities: Normal range of motion.     ental Status: Normal mood and affect. Normal behavior. Normal judgment and thought content.     Assessment   30 y.o. G1P0000 at [redacted]w[redacted]d by  04/29/2021, by Last Menstrual Period  presenting for routine prenatal visit  Plan   Pregnancy 1 Problems (from 09/18/20 to present)     Problem Noted Resolved   Supervision of low-risk first pregnancy 10/04/2020 by Conard Novak, MD No   Overview Addendum 11/12/2020 10:36 AM by Vena Austria, MD    [facility]inic [facility] Prenatal Labs  Dating L=10 Blood type: O/Positive/-- (04/11 1529)   Genetic Screen InheritestL CF neg, Fragile-X neg, SMA neg NIPS: Normal XY Antibody:Negative (04/11 1529)  Anatomic [place]  Rubella: 7.37 (04/11 1529) Varicella: @VZVIGG @  GTT Early:               Third trimester:  RPR: Non Reactive (04/11 1529)   Rhogam  HBsAg: Negative (04/11 1529)   TDaP vaccine                       Flu Shot: HIV: Non Reactive (04/11 1529)   Baby Food                                GBS:   Contraception  Pap: 09/19/2020 NILM  CBB     CS/VBAC    Support Person                  Gestational age appropriate obstetric precautions including but not limited to vaginal bleeding, contractions, leaking of fluid and fetal movement were reviewed in detail with the patient.    1) Palpitations - cardiology referral palpitations  2) Anatomy scan already scheduled  3) Dysuria - normal dip but will send urine culture  Return in about 4 weeks (around 12/26/2020) for ROB.  12/28/2020, MD,  [facility], [facility] 11/28/2020, 4:59 PM

## 2020-11-28 NOTE — Progress Notes (Signed)
ROB - RM 4 °

## 2020-12-02 LAB — URINE CULTURE

## 2020-12-04 ENCOUNTER — Encounter: Payer: Self-pay | Admitting: Cardiology

## 2020-12-04 ENCOUNTER — Ambulatory Visit (INDEPENDENT_AMBULATORY_CARE_PROVIDER_SITE_OTHER): Payer: 59

## 2020-12-04 ENCOUNTER — Ambulatory Visit (INDEPENDENT_AMBULATORY_CARE_PROVIDER_SITE_OTHER): Payer: 59 | Admitting: Cardiology

## 2020-12-04 ENCOUNTER — Other Ambulatory Visit: Payer: Self-pay

## 2020-12-04 VITALS — BP 100/60 | HR 74 | Ht 63.0 in | Wt 165.0 lb

## 2020-12-04 DIAGNOSIS — R002 Palpitations: Secondary | ICD-10-CM

## 2020-12-04 DIAGNOSIS — R072 Precordial pain: Secondary | ICD-10-CM | POA: Diagnosis not present

## 2020-12-04 DIAGNOSIS — Z331 Pregnant state, incidental: Secondary | ICD-10-CM

## 2020-12-04 DIAGNOSIS — Z3A19 19 weeks gestation of pregnancy: Secondary | ICD-10-CM

## 2020-12-04 NOTE — Patient Instructions (Signed)
Medication Instructions:   Your physician recommends that you continue on your current medications as directed. Please refer to the Current Medication list given to you today.  *If you need a refill on your cardiac medications before your next appointment, please call your pharmacy*   Lab Work: None ordered If you have labs (blood work) drawn today and your tests are completely normal, you will receive your results only by: MyChart Message (if you have MyChart) OR A paper copy in the mail If you have any lab test that is abnormal or we need to change your treatment, we will call you to review the results.   Testing/Procedures:  Your physician has requested that you have an echocardiogram. Echocardiography is a painless test that uses sound waves to create images of your heart. It provides your doctor with information about the size and shape of your heart and how well your heart's chambers and valves are working. This procedure takes approximately one hour. There are no restrictions for this procedure.  2.   Your physician has recommended that you wear a Zio monitor.   This monitor is a medical device that records the heart's electrical activity. Doctors most often use these monitors to diagnose arrhythmias. Arrhythmias are problems with the speed or rhythm of the heartbeat. The monitor is a small device applied to your chest. You can wear one while you do your normal daily activities. While wearing this monitor if you have any symptoms to push the button and record what you felt. Once you have worn this monitor for the period of time provider prescribed (Usually 14 days), you will return the monitor device in the postage paid box. Once it is returned they will download the data collected and provide [place] with a report which the provider will then review and we will call you with those results. Important tips:  Avoid showering during the first 24 hours of wearing the monitor. Avoid excessive  sweating to help maximize wear time. Do not submerge the device, no hot tubs, and no swimming pools. Keep any lotions or oils away from the patch. After 24 hours you may shower with the patch on. Take brief showers with your back facing the shower head.  Do not remove patch once it has been placed because that will interrupt data and decrease adhesive wear time. Push the button when you have any symptoms and write down what you were feeling. Once you have completed wearing your monitor, remove and place into box which has postage paid and place in your outgoing mailbox.  If for some reason you have misplaced your box then call our office and we can provide another box and/or mail it off for you.      Follow-Up: At [facility], you and your health needs are our priority.  As part of our continuing mission to provide you with exceptional heart care, we have created designated Provider Care Teams.  These Care Teams include your primary Cardiologist (physician) and Advanced Practice Providers (APPs -  Physician Assistants and Nurse Practitioners) who all work together to provide you with the care you need, when you need it.  We recommend signing up for the patient portal called "MyChart".  Sign up information is provided on this After Visit Summary.  MyChart is used to connect with patients for Virtual Visits (Telemedicine).  Patients are able to view lab/test results, encounter notes, upcoming appointments, etc.  Non-urgent messages can be sent to your provider as well.  To learn more about what you can do with MyChart, go to [email or web address].    Your next appointment:   Follow up after Echo and Zio (at least 6 weeks)   The format for your next appointment:   In Person  Provider:   Debbe Odea, MD   Other Instructions

## 2020-12-04 NOTE — Progress Notes (Signed)
Cardiology Office Note:    Date:  12/04/2020   ID:  Zoe Maldonado, DOB [birth year], MRN [id number]  PCP:  Patient, No Pcp Per (Inactive)   [facility] Providers Cardiologist:  Debbe Odea, MD     Referring MD: Vena Austria, MD   Chief Complaint  Patient presents with   New Patient (Initial Visit)    Referred by [facility] for palpitations. Meds reviewed verbally with patient.    Zoe Maldonado is a 30 y.o. female who is being seen today for the evaluation of palpitations at the request of Vena Austria, MD.   History of Present Illness:    Zoe Maldonado is a 30 y.o. female, former smoker x7 years, currently [redacted] weeks pregnant who presents due to palpitations and chest pain.  Patient is currently [redacted] weeks pregnant, has noticed occasional heart flutters/fast heartbeats since becoming pregnant.  Symptoms occur about 3 times a week, lasting a few seconds.  Denies dizziness, passing out.  Has also noticed occasional chest discomfort not related with palpitations or exertion.  Chest discomfort is noticeable, about 6 out of 10 in severity.  States feels like someone punched her chest.  She denies any history of heart disease.  Past Medical History:  Diagnosis Date   Asthma    as a child    Past Surgical History:  Procedure Laterality Date   Nexplanon  2016    Current Medications: Current Meds  Medication Sig   Prenatal Vit-Fe Fumarate-FA (PRENATAL MULTIVITAMIN) TABS tablet Take 1 tablet by mouth daily at 12 noon.     Allergies:   Aspirin and Latex   Social History   Socioeconomic History   Marital status: Married    Spouse name: Not on file   Number of children: Not on file   Years of education: Not on file   Highest education level: Not on file  Occupational History   Not on file  Tobacco Use   Smoking status: Former    Pack years: 0.00   Smokeless tobacco: Never  Vaping Use   Vaping Use: Some days  Substance and Sexual Activity    Alcohol use: Yes    Comment: Rarely   Drug use: Yes    Types: Marijuana, Other-see comments    Comment: Former, not in three yrs   Sexual activity: Yes    Birth control/protection: None  Other Topics Concern   Not on file  Social History Narrative   Not on file   Social Determinants of Health   Financial Resource Strain: Not on file  Food Insecurity: Not on file  Transportation Needs: Not on file  Physical Activity: Not on file  Stress: Not on file  Social Connections: Not on file     Family History: The patient's family history includes ADD / ADHD in her brother; Arthritis in her mother; Asthma in her brother; Diabetes in her maternal aunt.  ROS:   Please see the history of present illness.     All other systems reviewed and are negative.  EKGs/Labs/Other Studies Reviewed:    The following studies were reviewed today:   EKG:  EKG is  ordered today.  The ekg ordered today demonstrates normal sinus rhythm, normal ECG  Recent Labs: 09/18/2020: Hemoglobin 13.4; Platelets 397  Recent Lipid Panel No results found for: CHOL, TRIG, HDL, CHOLHDL, VLDL, LDLCALC, LDLDIRECT   Risk Assessment/Calculations:          Physical Exam:    VS:  BP 100/60 (BP Location: Left Arm, Patient  Position: Sitting, Cuff Size: Normal)   Pulse 74   Ht 5\' 3"  (1.6 m)   Wt 165 lb (74.8 kg)   LMP 07/23/2020   SpO2 98%   BMI 29.23 kg/m     Wt Readings from Last 3 Encounters:  12/04/20 165 lb (74.8 kg)  11/28/20 164 lb (74.4 kg)  10/31/20 159 lb (72.1 kg)     GEN:  Well nourished, well developed in no acute distress HEENT: Normal NECK: No JVD; No carotid bruits LYMPHATICS: No lymphadenopathy CARDIAC: RRR, no murmurs, rubs, gallops RESPIRATORY:  Clear to auscultation without rales, wheezing or rhonchi  ABDOMEN: Soft, non-tender, non-distended MUSCULOSKELETAL:  No edema; No deformity  SKIN: Warm and dry NEUROLOGIC:  Alert and oriented x 3 PSYCHIATRIC:  Normal affect   ASSESSMENT:     1. Palpitations   2. Precordial pain   3. [redacted] weeks gestation of pregnancy    PLAN:    In order of problems listed above:  Palpitations, possibly from cardiac stretching with increased volume secondary to pregnancy.  Place 2-week cardiac monitor to evaluate any significant arrhythmias. Chest pain, nonexertional, appears noncardiac.  She is low risk for CAD.  Currently pregnant.  Obtain echo to evaluate any structural abnormalities. 19 weeks pregnancy, management as per OB  Follow-up after cardiac monitor and echo     Medication Adjustments/Labs and Tests Ordered: Current medicines are reviewed at length with the patient today.  Concerns regarding medicines are outlined above.  Orders Placed This Encounter  Procedures   LONG TERM MONITOR (3-14 DAYS)   EKG 12-Lead   ECHOCARDIOGRAM COMPLETE    No orders of the defined types were placed in this encounter.   Patient Instructions  Medication Instructions:   Your physician recommends that you continue on your current medications as directed. Please refer to the Current Medication list given to you today.  *If you need a refill on your cardiac medications before your next appointment, please call your pharmacy*   Lab Work: None ordered If you have labs (blood work) drawn today and your tests are completely normal, you will receive your results only by: MyChart Message (if you have MyChart) OR A paper copy in the mail If you have any lab test that is abnormal or we need to change your treatment, we will call you to review the results.   Testing/Procedures:  Your physician has requested that you have an echocardiogram. Echocardiography is a painless test that uses sound waves to create images of your heart. It provides your doctor with information about the size and shape of your heart and how well your heart's chambers and valves are working. This procedure takes approximately one hour. There are no restrictions for this  procedure.  2.   Your physician has recommended that you wear a Zio monitor.   This monitor is a medical device that records the heart's electrical activity. Doctors most often use these monitors to diagnose arrhythmias. Arrhythmias are problems with the speed or rhythm of the heartbeat. The monitor is a small device applied to your chest. You can wear one while you do your normal daily activities. While wearing this monitor if you have any symptoms to push the button and record what you felt. Once you have worn this monitor for the period of time provider prescribed (Usually 14 days), you will return the monitor device in the postage paid box. Once it is returned they will download the data collected and provide 11/02/20 with a report which the  provider will then review and we will call you with those results. Important tips:  Avoid showering during the first 24 hours of wearing the monitor. Avoid excessive sweating to help maximize wear time. Do not submerge the device, no hot tubs, and no swimming pools. Keep any lotions or oils away from the patch. After 24 hours you may shower with the patch on. Take brief showers with your back facing the shower head.  Do not remove patch once it has been placed because that will interrupt data and decrease adhesive wear time. Push the button when you have any symptoms and write down what you were feeling. Once you have completed wearing your monitor, remove and place into box which has postage paid and place in your outgoing mailbox.  If for some reason you have misplaced your box then call our office and we can provide another box and/or mail it off for you.      Follow-Up: At [facility], you and your health needs are our priority.  As part of our continuing mission to provide you with exceptional heart care, we have created designated Provider Care Teams.  These Care Teams include your primary Cardiologist (physician) and Advanced Practice Providers (APPs  -  Physician Assistants and Nurse Practitioners) who all work together to provide you with the care you need, when you need it.  We recommend signing up for the patient portal called "MyChart".  Sign up information is provided on this After Visit Summary.  MyChart is used to connect with patients for Virtual Visits (Telemedicine).  Patients are able to view lab/test results, encounter notes, upcoming appointments, etc.  Non-urgent messages can be sent to your provider as well.   To learn more about what you can do with MyChart, go to [email or web address].    Your next appointment:   Follow up after Echo and Zio (at least 6 weeks)   The format for your next appointment:   In Person  Provider:   Debbe Odea, MD   Other Instructions    Signed, Debbe Odea, MD  12/04/2020 11:50 AM    [facility]

## 2020-12-12 ENCOUNTER — Other Ambulatory Visit: Payer: Self-pay

## 2020-12-12 ENCOUNTER — Ambulatory Visit
Admission: RE | Admit: 2020-12-12 | Discharge: 2020-12-12 | Disposition: A | Discharge status: Home / Self Care | Payer: 59 | Source: Ambulatory Visit | Attending: Obstetrics and Gynecology | Admitting: Obstetrics and Gynecology

## 2020-12-12 DIAGNOSIS — Z363 Encounter for antenatal screening for malformations: Secondary | ICD-10-CM

## 2020-12-12 DIAGNOSIS — Z3401 Encounter for supervision of normal first pregnancy, first trimester: Secondary | ICD-10-CM | POA: Diagnosis present

## 2020-12-13 ENCOUNTER — Encounter: Payer: 59 | Admitting: Obstetrics and Gynecology

## 2020-12-13 VITALS — BP 102/64 | Wt 166.0 lb

## 2020-12-13 LAB — POCT URINALYSIS DIPSTICK OB
Glucose, UA: NEGATIVE
POC,PROTEIN,UA: NEGATIVE

## 2020-12-13 NOTE — Progress Notes (Signed)
error 

## 2020-12-19 ENCOUNTER — Encounter: Payer: Self-pay | Admitting: Obstetrics and Gynecology

## 2020-12-19 ENCOUNTER — Ambulatory Visit (INDEPENDENT_AMBULATORY_CARE_PROVIDER_SITE_OTHER): Payer: 59 | Admitting: Obstetrics and Gynecology

## 2020-12-19 DIAGNOSIS — Z3402 Encounter for supervision of normal first pregnancy, second trimester: Secondary | ICD-10-CM

## 2020-12-19 NOTE — Progress Notes (Signed)
Routine Prenatal Care Visit  Subjective  Zoe Maldonado is a 30 y.o. G1P0000 at [redacted]w[redacted]d being seen today for ongoing prenatal care.  She is currently monitored for the following issues for this low-risk pregnancy and has Perianal abscess; Fistula, anal; and Supervision of low-risk first pregnancy on their problem list.  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  She also has a left sided pain that with deep palpation is painful and sore. Denies bowel movement changes.  Contractions: Not present. Vag. Bleeding: None.  Movement: Present. Denies leaking of fluid.  ----------------------------------------------------------------------------------- The following portions of the patient's history were reviewed and updated as appropriate: allergies, current medications, past family history, past medical history, past social history, past surgical history and problem list. Problem list updated.   Objective  Blood pressure 116/70, height 5\' 3"  (1.6 m), weight 169 lb 12.8 oz (77 kg), last menstrual period 07/23/2020. Pregravid weight 154 lb (69.9 kg) Total Weight Gain 15 lb 12.8 oz (7.167 kg) Urinalysis:      Fetal Status: Fetal Heart Rate (bpm): 145   Movement: Present     General:  Alert, oriented and cooperative. Patient is in no acute distress.  Skin: Skin is warm and dry. No rash noted.   Cardiovascular: Normal heart rate noted  Respiratory: Normal respiratory effort, no problems with respiration noted  Abdomen: Soft, gravid, appropriate for gestational age. Pain/Pressure: Present     Pelvic:  Cervical exam deferred        Extremities: Normal range of motion.  Edema: None  Mental Status: Normal mood and affect. Normal behavior. Normal judgment and thought content.     Assessment   30 y.o. G1P0000 at [redacted]w[redacted]d by  04/29/2021, by Last Menstrual Period  presenting for routine prenatal visit  Plan   Pregnancy 1 Problems (from 09/18/20 to present)     Problem Noted Resolved   Supervision of low-risk first pregnancy 10/04/2020 by 10/06/2020, MD No   Overview Addendum 12/19/2020  5:19 PM by 02/19/2021, MD     Nursing Staff Provider  Office Location  [facility] Dating  LMP = 9 wk Natale Milch  Language  English Anatomy US  complete  Flu Vaccine   Genetic Screen  NIPS: normal xy  TDaP vaccine    Hgb A1C or  GTT Early :hgba1c 5.2 Third trimester :   Covid vaccinated   LAB RESULTS   Rhogam  Not needed Blood Type O/Positive/-- (04/11 1529)   Feeding Plan  Breast Antibody Negative (04/11 1529)  Contraception  Rubella 7.37 (04/11 1529)  Circumcision  RPR Non Reactive (04/11 1529)   Pediatrician   HBsAg Negative (04/11 1529)   Support Person 08-06-1970 HIV Non Reactive (04/11 1529)  Prenatal Classes Discussed Varicella  Immune    GBS  (For PCN allergy, check sensitivities)   BTL Consent     VBAC Consent  Pap  2022 NIL    Hgb Electro      CF      SMA         History of anal fistula           Discussed patient's history of anal fistula.  Patient denies history of a diverting Colostomy or vaginal mesh.  Review of care everywhere suggest that the patient had a seton placed which helped resolve the fistula.  Recommended consultation by the patient with the colorectal surgeon regarding thoughts of vaginal delivery versus planned C-section.  Gestational  age appropriate obstetric precautions including but not limited to vaginal bleeding, contractions, leaking of fluid and fetal movement were reviewed in detail with the patient.    Return in about 4 weeks (around 01/16/2021) for ROB visit.  Natale Milch MD [facility], [facility] 12/19/2020, 5:21 PM

## 2020-12-25 ENCOUNTER — Encounter: Payer: 59 | Admitting: Obstetrics and Gynecology

## 2021-01-16 ENCOUNTER — Ambulatory Visit (INDEPENDENT_AMBULATORY_CARE_PROVIDER_SITE_OTHER): Payer: 59 | Admitting: Obstetrics and Gynecology

## 2021-01-16 ENCOUNTER — Other Ambulatory Visit: Payer: Self-pay

## 2021-01-16 ENCOUNTER — Encounter: Payer: Self-pay | Admitting: Obstetrics and Gynecology

## 2021-01-16 VITALS — BP 100/70 | Wt 173.8 lb

## 2021-01-16 DIAGNOSIS — Z3A25 25 weeks gestation of pregnancy: Secondary | ICD-10-CM

## 2021-01-16 DIAGNOSIS — Z3402 Encounter for supervision of normal first pregnancy, second trimester: Secondary | ICD-10-CM

## 2021-01-16 DIAGNOSIS — Z113 Encounter for screening for infections with a predominantly sexual mode of transmission: Secondary | ICD-10-CM

## 2021-01-16 DIAGNOSIS — Z131 Encounter for screening for diabetes mellitus: Secondary | ICD-10-CM

## 2021-01-16 LAB — POCT URINALYSIS DIPSTICK OB
Glucose, UA: NEGATIVE
POC,PROTEIN,UA: NEGATIVE

## 2021-01-16 NOTE — Addendum Note (Signed)
Addended by: Cornelius Moras D on: 01/16/2021 11:45 AM   Modules accepted: Orders

## 2021-01-16 NOTE — Progress Notes (Signed)
Routine Prenatal Care Visit  Subjective  Zoe Maldonado is a 30 y.o. G1P0000 at [redacted]w[redacted]d being seen today for ongoing prenatal care.  She is currently monitored for the following issues for this low-risk pregnancy and has Perianal abscess; Fistula, anal; and Supervision of low-risk first pregnancy on their problem list.  ----------------------------------------------------------------------------------- Patient reports no complaints.   Contractions: Not present. Vag. Bleeding: None.  Movement: Present. Leaking Fluid denies.  ----------------------------------------------------------------------------------- The following portions of the patient's history were reviewed and updated as appropriate: allergies, current medications, past family history, past medical history, past social history, past surgical history and problem list. Problem list updated.  Objective  Blood pressure 100/70, weight 173 lb 12.8 oz (78.8 kg), last menstrual period 07/23/2020. Pregravid weight 154 lb (69.9 kg) Total Weight Gain 19 lb 12.8 oz (8.981 kg) Urinalysis: Urine Protein    Urine Glucose    Fetal Status: Fetal Heart Rate (bpm): 135 Fundal Height: 25 cm Movement: Present     General:  Alert, oriented and cooperative. Patient is in no acute distress.  Skin: Skin is warm and dry. No rash noted.   Cardiovascular: Normal heart rate noted  Respiratory: Normal respiratory effort, no problems with respiration noted  Abdomen: Soft, gravid, appropriate for gestational age. Pain/Pressure: Present     Pelvic:  Cervical exam deferred        Extremities: Normal range of motion.  Edema: None  Mental Status: Normal mood and affect. Normal behavior. Normal judgment and thought content.   Assessment   30 y.o. G1P0000 at [redacted]w[redacted]d by  04/29/2021, by Last Menstrual Period presenting for routine prenatal visit  Plan   Pregnancy 1 Problems (from 09/18/20 to present)     Problem Noted Resolved   Supervision of low-risk  first pregnancy 10/04/2020 by Conard Novak, MD No   Overview Addendum 01/16/2021 11:26 AM by Conard Novak, MD     Nursing Staff Provider  Office Location  [facility] Dating  LMP = 9 wk [place]  Language  English Anatomy US  complete  Flu Vaccine   Genetic Screen  NIPS: normal xy  TDaP vaccine    Hgb A1C or  GTT Early :hgba1c 5.2, 3h gtt nml (4/4) Third trimester :   Covid vaccinated   LAB RESULTS   Rhogam  Not needed Blood Type O/Positive/-- (04/11 1529)   Feeding Plan  Breast Antibody Negative (04/11 1529)  Contraception  Rubella 7.37 (04/11 1529)  Circumcision  RPR Non Reactive (04/11 1529)   Pediatrician   HBsAg Negative (04/11 1529)   Support Person Ivin Booty HIV Non Reactive (04/11 1529)  Prenatal Classes Discussed Varicella  Immune    GBS  (For PCN allergy, check sensitivities)   BTL Consent     VBAC Consent  Pap  2022 NIL    Hgb Electro      CF      SMA         History of anal fistula           Preterm labor symptoms and general obstetric precautions including but not limited to vaginal bleeding, contractions, leaking of fluid and fetal movement were reviewed in detail with the patient. Please refer to After Visit Summary for other counseling recommendations.   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  Seems overall low risk for vaginal delivery - 2h gtt with 28 week labs at next visit.   Return in about 3 weeks (  around 02/06/2021) for 2h gtt with 28 week labs and routine prenatal.   Thomasene Mohair, MD, [facility], [facility] 01/16/2021 11:41 AM

## 2021-01-24 ENCOUNTER — Other Ambulatory Visit: Payer: 59

## 2021-01-26 ENCOUNTER — Ambulatory Visit: Payer: 59 | Admitting: Cardiology

## 2021-02-09 ENCOUNTER — Encounter: Payer: Self-pay | Admitting: Obstetrics and Gynecology

## 2021-02-09 ENCOUNTER — Other Ambulatory Visit: Payer: Self-pay

## 2021-02-09 ENCOUNTER — Ambulatory Visit (INDEPENDENT_AMBULATORY_CARE_PROVIDER_SITE_OTHER): Payer: 59 | Admitting: Obstetrics and Gynecology

## 2021-02-09 ENCOUNTER — Other Ambulatory Visit: Payer: 59

## 2021-02-09 VITALS — BP 118/74 | Wt 177.0 lb

## 2021-02-09 DIAGNOSIS — Z3A28 28 weeks gestation of pregnancy: Secondary | ICD-10-CM

## 2021-02-09 DIAGNOSIS — Z3403 Encounter for supervision of normal first pregnancy, third trimester: Secondary | ICD-10-CM

## 2021-02-09 DIAGNOSIS — Z131 Encounter for screening for diabetes mellitus: Secondary | ICD-10-CM

## 2021-02-09 DIAGNOSIS — Z113 Encounter for screening for infections with a predominantly sexual mode of transmission: Secondary | ICD-10-CM

## 2021-02-09 DIAGNOSIS — Z3402 Encounter for supervision of normal first pregnancy, second trimester: Secondary | ICD-10-CM

## 2021-02-09 NOTE — Progress Notes (Signed)
  Routine Prenatal Care Visit  Subjective  Zoe Maldonado is a 30 y.o. G1P0000 at [redacted]w[redacted]d being seen today for ongoing prenatal care.  She is currently monitored for the following issues for this low-risk pregnancy and has Perianal abscess; Fistula, anal; and Supervision of low-risk first pregnancy on their problem list.  ----------------------------------------------------------------------------------- Patient reports no complaints.   Contractions: Not present. Vag. Bleeding: None.  Movement: Present. Leaking Fluid denies.  ----------------------------------------------------------------------------------- The following portions of the patient's history were reviewed and updated as appropriate: allergies, current medications, past family history, past medical history, past social history, past surgical history and problem list. Problem list updated.  Objective  Blood pressure 118/74, weight 177 lb (80.3 kg), last menstrual period 07/23/2020. Pregravid weight 154 lb (69.9 kg) Total Weight Gain 23 lb (10.4 kg) Urinalysis: Urine Protein    Urine Glucose    Fetal Status: Fetal Heart Rate (bpm): 130 Fundal Height: 28 cm Movement: Present     General:  Alert, oriented and cooperative. Patient is in no acute distress.  Skin: Skin is warm and dry. No rash noted.   Cardiovascular: Normal heart rate noted  Respiratory: Normal respiratory effort, no problems with respiration noted  Abdomen: Soft, gravid, appropriate for gestational age. Pain/Pressure: Absent     Pelvic:  Cervical exam deferred        Extremities: Normal range of motion.  Edema: None  Mental Status: Normal mood and affect. Normal behavior. Normal judgment and thought content.   Assessment   30 y.o. G1P0000 at [redacted]w[redacted]d by  04/29/2021, by Last Menstrual Period presenting for routine prenatal visit  Plan   Pregnancy 1 Problems (from 09/18/20 to present)     Problem Noted Resolved   Supervision of low-risk first pregnancy  10/04/2020 by Conard Novak, MD No   Overview Addendum 01/16/2021 11:26 AM by Conard Novak, MD     Nursing Staff Provider  Office Location  [facility] Dating  LMP = 9 wk [place]  Language  English Anatomy US  complete  Flu Vaccine   Genetic Screen  NIPS: normal xy  TDaP vaccine    Hgb A1C or  GTT Early :hgba1c 5.2, 3h gtt nml (4/4) Third trimester :   Covid vaccinated   LAB RESULTS   Rhogam  Not needed Blood Type O/Positive/-- (04/11 1529)   Feeding Plan  Breast Antibody Negative (04/11 1529)  Contraception  Rubella 7.37 (04/11 1529)  Circumcision  RPR Non Reactive (04/11 1529)   Pediatrician   HBsAg Negative (04/11 1529)   Support Person Ivin Booty HIV Non Reactive (04/11 1529)  Prenatal Classes Discussed Varicella  Immune    GBS  (For PCN allergy, check sensitivities)   BTL Consent     VBAC Consent  Pap  2022 NIL    Hgb Electro      CF      SMA         History of anal fistula           Preterm labor symptoms and general obstetric precautions including but not limited to vaginal bleeding, contractions, leaking of fluid and fetal movement were reviewed in detail with the patient. Please refer to After Visit Summary for other counseling recommendations.   -28 wk labs with 2h gtt today (based on prior discussion).   Return in about 2 weeks (around 02/23/2021) for ROB.   Thomasene Mohair, MD, [facility], [facility] 02/09/2021 9:07 AM

## 2021-02-10 LAB — CBC WITH DIFFERENTIAL/PLATELET
Basophils Absolute: 0 10*3/uL (ref 0.0–0.2)
Basos: 0 %
EOS (ABSOLUTE): 0.1 10*3/uL (ref 0.0–0.4)
Eos: 1 %
Hematocrit: 36 % (ref 34.0–46.6)
Hemoglobin: 11.6 g/dL (ref 11.1–15.9)
Immature Grans (Abs): 0.1 10*3/uL (ref 0.0–0.1)
Immature Granulocytes: 1 %
Lymphocytes Absolute: 2.4 10*3/uL (ref 0.7–3.1)
Lymphs: 20 %
MCH: 30.1 pg (ref 26.6–33.0)
MCHC: 32.2 g/dL (ref 31.5–35.7)
MCV: 93 fL (ref 79–97)
Monocytes Absolute: 0.9 10*3/uL (ref 0.1–0.9)
Monocytes: 8 %
Neutrophils Absolute: 8.2 10*3/uL — ABNORMAL HIGH (ref 1.4–7.0)
Neutrophils: 70 %
Platelets: 295 10*3/uL (ref 150–450)
RBC: 3.86 x10E6/uL (ref 3.77–5.28)
RDW: 12.3 % (ref 11.7–15.4)
WBC: 11.8 10*3/uL — ABNORMAL HIGH (ref 3.4–10.8)

## 2021-02-10 LAB — GLUCOSE TOLERANCE, 2 HOURS W/ 1HR
Glucose, 1 hour: 145 mg/dL (ref 65–179)
Glucose, 2 hour: 125 mg/dL (ref 65–152)
Glucose, Fasting: 74 mg/dL (ref 65–91)

## 2021-02-10 LAB — RPR QUALITATIVE: RPR Ser Ql: NONREACTIVE

## 2021-02-10 LAB — HIV ANTIBODY (ROUTINE TESTING W REFLEX): HIV Screen 4th Generation wRfx: NONREACTIVE

## 2021-02-26 ENCOUNTER — Encounter: Payer: Self-pay | Admitting: Obstetrics and Gynecology

## 2021-02-26 ENCOUNTER — Ambulatory Visit (INDEPENDENT_AMBULATORY_CARE_PROVIDER_SITE_OTHER): Payer: 59 | Admitting: Obstetrics and Gynecology

## 2021-02-26 ENCOUNTER — Other Ambulatory Visit: Payer: Self-pay

## 2021-02-26 VITALS — BP 122/74 | Wt 182.0 lb

## 2021-02-26 DIAGNOSIS — Z3403 Encounter for supervision of normal first pregnancy, third trimester: Secondary | ICD-10-CM

## 2021-02-26 DIAGNOSIS — Z3A31 31 weeks gestation of pregnancy: Secondary | ICD-10-CM

## 2021-02-26 NOTE — Progress Notes (Signed)
  Routine Prenatal Care Visit  Subjective  Zoe Maldonado is a 30 y.o. G1P0000 at [redacted]w[redacted]d being seen today for ongoing prenatal care.  She is currently monitored for the following issues for this low-risk pregnancy and has Perianal abscess; Fistula, anal; and Supervision of low-risk first pregnancy on their problem list.  ----------------------------------------------------------------------------------- Patient reports no complaints.   Contractions: Not present. Vag. Bleeding: None.  Movement: Present. Leaking Fluid denies.  ----------------------------------------------------------------------------------- The following portions of the patient's history were reviewed and updated as appropriate: allergies, current medications, past family history, past medical history, past social history, past surgical history and problem list. Problem list updated.  Objective  Blood pressure 122/74, weight 182 lb (82.6 kg), last menstrual period 07/23/2020. Pregravid weight 154 lb (69.9 kg) Total Weight Gain 28 lb (12.7 kg) Urinalysis: Urine Protein    Urine Glucose    Fetal Status: Fetal Heart Rate (bpm): 140 Fundal Height: 30 cm Movement: Present     General:  Alert, oriented and cooperative. Patient is in no acute distress.  Skin: Skin is warm and dry. No rash noted.   Cardiovascular: Normal heart rate noted  Respiratory: Normal respiratory effort, no problems with respiration noted  Abdomen: Soft, gravid, appropriate for gestational age. Pain/Pressure: Absent     Pelvic:  Cervical exam deferred        Extremities: Normal range of motion.  Edema: None  Mental Status: Normal mood and affect. Normal behavior. Normal judgment and thought content.   Assessment   30 y.o. G1P0000 at [redacted]w[redacted]d by  04/29/2021, by Last Menstrual Period presenting for routine prenatal visit  Plan   Pregnancy 1 Problems (from 09/18/20 to present)     Problem Noted Resolved   Supervision of low-risk first pregnancy  10/04/2020 by Conard Novak, MD No   Overview Addendum 02/26/2021 12:03 PM by Conard Novak, MD     Nursing Staff Provider  Office Location  [facility] Dating  LMP = 9 wk [place]  Language  English Anatomy US  complete  Flu Vaccine   Genetic Screen  NIPS: normal xy  TDaP vaccine    Hgb A1C or  GTT Early :hgba1c 5.2, 3h gtt nml (4/4) Third trimester : 2h gtt nml (3/3)  Covid vaccinated   LAB RESULTS   Rhogam  Not needed Blood Type O/Positive/-- (04/11 1529)   Feeding Plan  Breast Antibody Negative (04/11 1529)  Contraception  Rubella 7.37 (04/11 1529)  Circumcision  RPR Non Reactive (04/11 1529)   Pediatrician   HBsAg Negative (04/11 1529)   Support Person Ivin Booty HIV Non Reactive (04/11 1529)  Prenatal Classes Discussed Varicella  Immune    GBS  (For PCN allergy, check sensitivities)   BTL Consent     VBAC Consent  Pap  2022 NIL    Hgb Electro      CF      SMA         History of anal fistula           Preterm labor symptoms and general obstetric precautions including but not limited to vaginal bleeding, contractions, leaking of fluid and fetal movement were reviewed in detail with the patient. Please refer to After Visit Summary for other counseling recommendations.   - discussed TDaP. She'll discuss with her husband and let us know.   Return in about 2 weeks (around 03/12/2021) for ROB.  Thomasene Mohair, MD, [facility], [facility] 02/26/2021 12:15 PM

## 2021-03-12 ENCOUNTER — Other Ambulatory Visit: Payer: Self-pay

## 2021-03-12 ENCOUNTER — Encounter: Payer: Self-pay | Admitting: Obstetrics and Gynecology

## 2021-03-12 ENCOUNTER — Ambulatory Visit (INDEPENDENT_AMBULATORY_CARE_PROVIDER_SITE_OTHER): Payer: 59 | Admitting: Obstetrics and Gynecology

## 2021-03-12 VITALS — BP 120/76 | Wt 182.0 lb

## 2021-03-12 DIAGNOSIS — Z23 Encounter for immunization: Secondary | ICD-10-CM

## 2021-03-12 DIAGNOSIS — Z3A33 33 weeks gestation of pregnancy: Secondary | ICD-10-CM

## 2021-03-12 DIAGNOSIS — Z3403 Encounter for supervision of normal first pregnancy, third trimester: Secondary | ICD-10-CM

## 2021-03-12 NOTE — Progress Notes (Signed)
  Routine Prenatal Care Visit  Subjective  Zoe Maldonado is a 30 y.o. G1P0000 at [redacted]w[redacted]d being seen today for ongoing prenatal care.  She is currently monitored for the following issues for this low-risk pregnancy and has Perianal abscess; Fistula, anal; and Supervision of low-risk first pregnancy on their problem list.  ----------------------------------------------------------------------------------- Patient reports no complaints.   Contractions: Not present. Vag. Bleeding: None.  Movement: Present. Leaking Fluid denies.  ----------------------------------------------------------------------------------- The following portions of the patient's history were reviewed and updated as appropriate: allergies, current medications, past family history, past medical history, past social history, past surgical history and problem list. Problem list updated.  Objective  Blood pressure 120/76, weight 182 lb (82.6 kg), last menstrual period 07/23/2020. Pregravid weight 154 lb (69.9 kg) Total Weight Gain 28 lb (12.7 kg) Urinalysis: Urine Protein    Urine Glucose    Fetal Status: Fetal Heart Rate (bpm): 160 Fundal Height: 34 cm Movement: Present     General:  Alert, oriented and cooperative. Patient is in no acute distress.  Skin: Skin is warm and dry. No rash noted.   Cardiovascular: Normal heart rate noted  Respiratory: Normal respiratory effort, no problems with respiration noted  Abdomen: Soft, gravid, appropriate for gestational age. Pain/Pressure: Absent     Pelvic:  Cervical exam deferred        Extremities: Normal range of motion.  Edema: None  Mental Status: Normal mood and affect. Normal behavior. Normal judgment and thought content.   Assessment   30 y.o. G1P0000 at [redacted]w[redacted]d by  04/29/2021, by Last Menstrual Period presenting for routine prenatal visit  Plan   Pregnancy 1 Problems (from 09/18/20 to present)     Problem Noted Resolved   Supervision of low-risk first pregnancy  10/04/2020 by Conard Novak, MD No   Overview Addendum 02/26/2021 12:03 PM by Conard Novak, MD     Nursing Staff Provider  Office Location  [facility] Dating  LMP = 9 wk [place]  Language  English Anatomy US  complete  Flu Vaccine   Genetic Screen  NIPS: normal xy  TDaP vaccine    Hgb A1C or  GTT Early :hgba1c 5.2, 3h gtt nml (4/4) Third trimester : 2h gtt nml (3/3)  Covid vaccinated   LAB RESULTS   Rhogam  Not needed Blood Type O/Positive/-- (04/11 1529)   Feeding Plan  Breast Antibody Negative (04/11 1529)  Contraception  Rubella 7.37 (04/11 1529)  Circumcision  RPR Non Reactive (04/11 1529)   Pediatrician   HBsAg Negative (04/11 1529)   Support Person Ivin Booty HIV Non Reactive (04/11 1529)  Prenatal Classes Discussed Varicella  Immune    GBS  (For PCN allergy, check sensitivities)   BTL Consent     VBAC Consent  Pap  2022 NIL    Hgb Electro      CF      SMA         History of anal fistula           Preterm labor symptoms and general obstetric precautions including but not limited to vaginal bleeding, contractions, leaking of fluid and fetal movement were reviewed in detail with the patient. Please refer to After Visit Summary for other counseling recommendations.   TDaP today Message to Dr. Luciano Cutter at [facility] to verify vaginal delivery OK.   Return in about 2 weeks (around 03/26/2021) for ROB.   Thomasene Mohair, MD, [facility], [facility] 03/12/2021 2:54 PM

## 2021-03-26 ENCOUNTER — Other Ambulatory Visit: Payer: Self-pay

## 2021-03-26 ENCOUNTER — Ambulatory Visit (INDEPENDENT_AMBULATORY_CARE_PROVIDER_SITE_OTHER): Payer: 59 | Admitting: Obstetrics and Gynecology

## 2021-03-26 ENCOUNTER — Encounter: Payer: Self-pay | Admitting: Obstetrics and Gynecology

## 2021-03-26 VITALS — BP 122/74 | Wt 187.0 lb

## 2021-03-26 DIAGNOSIS — Z3A35 35 weeks gestation of pregnancy: Secondary | ICD-10-CM

## 2021-03-26 DIAGNOSIS — Z3403 Encounter for supervision of normal first pregnancy, third trimester: Secondary | ICD-10-CM

## 2021-03-26 NOTE — Progress Notes (Signed)
  Routine Prenatal Care Visit  Subjective  Zoe Maldonado is a 30 y.o. G1P0000 at [redacted]w[redacted]d being seen today for ongoing prenatal care.  She is currently monitored for the following issues for this low-risk pregnancy and has Perianal abscess; Fistula, anal; and Supervision of low-risk first pregnancy on their problem list.  ----------------------------------------------------------------------------------- Patient reports no complaints.   Contractions: Not present. Vag. Bleeding: None.  Movement: Present. Leaking Fluid denies.  ----------------------------------------------------------------------------------- The following portions of the patient's history were reviewed and updated as appropriate: allergies, current medications, past family history, past medical history, past social history, past surgical history and problem list. Problem list updated.  Objective  Blood pressure 122/74, weight 187 lb (84.8 kg), last menstrual period 07/23/2020. Pregravid weight 154 lb (69.9 kg) Total Weight Gain 33 lb (15 kg) Urinalysis: Urine Protein    Urine Glucose    Fetal Status: Fetal Heart Rate (bpm): 145 Fundal Height: 25 cm Movement: Present     General:  Alert, oriented and cooperative. Patient is in no acute distress.  Skin: Skin is warm and dry. No rash noted.   Cardiovascular: Normal heart rate noted  Respiratory: Normal respiratory effort, no problems with respiration noted  Abdomen: Soft, gravid, appropriate for gestational age. Pain/Pressure: Absent     Pelvic:  Cervical exam deferred        Extremities: Normal range of motion.  Edema: None  Mental Status: Normal mood and affect. Normal behavior. Normal judgment and thought content.   Assessment   30 y.o. G1P0000 at [redacted]w[redacted]d by  04/29/2021, by Last Menstrual Period presenting for routine prenatal visit  Plan   Pregnancy 1 Problems (from 09/18/20 to present)     Problem Noted Resolved   Supervision of low-risk first pregnancy  10/04/2020 by Conard Novak, MD No   Overview Addendum 02/26/2021 12:03 PM by Conard Novak, MD     Nursing Staff Provider  Office Location  [facility] Dating  LMP = 9 wk [place]  Language  English Anatomy US  complete  Flu Vaccine   Genetic Screen  NIPS: normal xy  TDaP vaccine    Hgb A1C or  GTT Early :hgba1c 5.2, 3h gtt nml (4/4) Third trimester : 2h gtt nml (3/3)  Covid vaccinated   LAB RESULTS   Rhogam  Not needed Blood Type O/Positive/-- (04/11 1529)   Feeding Plan  Breast Antibody Negative (04/11 1529)  Contraception  Rubella 7.37 (04/11 1529)  Circumcision  RPR Non Reactive (04/11 1529)   Pediatrician   HBsAg Negative (04/11 1529)   Support Person Ivin Booty HIV Non Reactive (04/11 1529)  Prenatal Classes Discussed Varicella  Immune    GBS  (For PCN allergy, check sensitivities)   BTL Consent     VBAC Consent  Pap  2022 NIL    Hgb Electro      CF      SMA         History of anal fistula           Preterm labor symptoms and general obstetric precautions including but not limited to vaginal bleeding, contractions, leaking of fluid and fetal movement were reviewed in detail with the patient. Please refer to After Visit Summary for other counseling recommendations.   GBS next visit  Return in about 3 weeks (around 04/16/2021) for ROB (may make multiple weekly visits after next visit).   Thomasene Mohair, MD, [facility], [facility] 03/26/2021 2:38 PM

## 2021-04-09 ENCOUNTER — Encounter: Payer: Self-pay | Admitting: Obstetrics and Gynecology

## 2021-04-09 ENCOUNTER — Ambulatory Visit (INDEPENDENT_AMBULATORY_CARE_PROVIDER_SITE_OTHER): Payer: 59 | Admitting: Obstetrics and Gynecology

## 2021-04-09 ENCOUNTER — Other Ambulatory Visit (HOSPITAL_COMMUNITY)
Admission: RE | Admit: 2021-04-09 | Discharge: 2021-04-09 | Disposition: A | Discharge status: Home / Self Care | Payer: 59 | Source: Ambulatory Visit | Attending: Obstetrics and Gynecology | Admitting: Obstetrics and Gynecology

## 2021-04-09 ENCOUNTER — Other Ambulatory Visit: Payer: Self-pay

## 2021-04-09 VITALS — BP 126/80 | Wt 191.0 lb

## 2021-04-09 DIAGNOSIS — Z3403 Encounter for supervision of normal first pregnancy, third trimester: Secondary | ICD-10-CM | POA: Insufficient documentation

## 2021-04-09 DIAGNOSIS — Z3A37 37 weeks gestation of pregnancy: Secondary | ICD-10-CM | POA: Diagnosis present

## 2021-04-09 NOTE — Progress Notes (Signed)
  Routine Prenatal Care Visit  Subjective  Zoe Maldonado is a 30 y.o. G1P0000 at [redacted]w[redacted]d being seen today for ongoing prenatal care.  She is currently monitored for the following issues for this low-risk pregnancy and has Perianal abscess; Fistula, anal; and Supervision of low-risk first pregnancy on their problem list.  ----------------------------------------------------------------------------------- Patient reports no complaints.   Contractions: Not present. Vag. Bleeding: None.  Movement: Present. Leaking Fluid denies.  ----------------------------------------------------------------------------------- The following portions of the patient's history were reviewed and updated as appropriate: allergies, current medications, past family history, past medical history, past social history, past surgical history and problem list. Problem list updated.  Objective  Blood pressure 126/80, weight 191 lb (86.6 kg), last menstrual period 07/23/2020. Pregravid weight 154 lb (69.9 kg) Total Weight Gain 37 lb (16.8 kg) Urinalysis: Urine Protein    Urine Glucose    Fetal Status: Fetal Heart Rate (bpm): 145 Fundal Height: 37 cm Movement: Present     General:  Alert, oriented and cooperative. Patient is in no acute distress.  Skin: Skin is warm and dry. No rash noted.   Cardiovascular: Normal heart rate noted  Respiratory: Normal respiratory effort, no problems with respiration noted  Abdomen: Soft, gravid, appropriate for gestational age. Pain/Pressure: Absent     Pelvic:  Cervical exam deferred        Extremities: Normal range of motion.  Edema: None  Mental Status: Normal mood and affect. Normal behavior. Normal judgment and thought content.   Female chaperone present for pelvic exam:   Assessment   30 y.o. G1P0000 30 y.o. G1P0000 at [redacted]w[redacted]d by  04/29/2021, by Last Menstrual Period presenting for routine prenatal visit  Plan   Pregnancy 1 Problems (from 09/18/20 to present)     Problem Noted Resolved    Supervision of low-risk first pregnancy 10/04/2020 by Conard Novak, MD No   Overview Addendum 02/26/2021 12:03 PM by Conard Novak, MD     Nursing Staff Provider  Office Location  [facility] Dating  LMP = 9 wk [place]  Language  English Anatomy US  complete  Flu Vaccine   Genetic Screen  NIPS: normal xy  TDaP vaccine    Hgb A1C or  GTT Early :hgba1c 5.2, 3h gtt nml (4/4) Third trimester : 2h gtt nml (3/3)  Covid vaccinated   LAB RESULTS   Rhogam  Not needed Blood Type O/Positive/-- (04/11 1529)   Feeding Plan  Breast Antibody Negative (04/11 1529)  Contraception  Rubella 7.37 (04/11 1529)  Circumcision  RPR Non Reactive (04/11 1529)   Pediatrician   HBsAg Negative (04/11 1529)   Support Person Ivin Booty HIV Non Reactive (04/11 1529)  Prenatal Classes Discussed Varicella  Immune    GBS  (For PCN allergy, check sensitivities)   BTL Consent     VBAC Consent  Pap  2022 NIL    Hgb Electro      CF      SMA         History of anal fistula           Term labor symptoms and general obstetric precautions including but not limited to vaginal bleeding, contractions, leaking of fluid and fetal movement were reviewed in detail with the patient. Please refer to After Visit Summary for other counseling recommendations.   -GBS/aptima today  Return in about 1 week (around 04/16/2021) for ROB (Keep previously scheduled appts).   Thomasene Mohair, MD, [facility], [facility] 04/09/2021 2:53 PM

## 2021-04-11 LAB — STREP GP B NAA: Strep Gp B NAA: NEGATIVE

## 2021-04-11 LAB — CERVICOVAGINAL ANCILLARY ONLY
Chlamydia: NEGATIVE
Comment: NEGATIVE
Comment: NORMAL
Neisseria Gonorrhea: NEGATIVE

## 2021-04-16 ENCOUNTER — Encounter: Payer: 59 | Admitting: Obstetrics and Gynecology

## 2021-04-19 ENCOUNTER — Ambulatory Visit (INDEPENDENT_AMBULATORY_CARE_PROVIDER_SITE_OTHER): Payer: 59 | Admitting: Obstetrics and Gynecology

## 2021-04-19 ENCOUNTER — Other Ambulatory Visit: Payer: Self-pay

## 2021-04-19 ENCOUNTER — Encounter: Payer: Self-pay | Admitting: Obstetrics and Gynecology

## 2021-04-19 VITALS — BP 121/82 | Wt 195.0 lb

## 2021-04-19 DIAGNOSIS — Z3A38 38 weeks gestation of pregnancy: Secondary | ICD-10-CM

## 2021-04-19 DIAGNOSIS — Z3403 Encounter for supervision of normal first pregnancy, third trimester: Secondary | ICD-10-CM

## 2021-04-19 NOTE — Progress Notes (Signed)
  Routine Prenatal Care Visit  Subjective  Zoe Maldonado is a 30 y.o. G1P0000 at [redacted]w[redacted]d being seen today for ongoing prenatal care.  She is currently monitored for the following issues for this low-risk pregnancy and has Perianal abscess; Fistula, anal; and Supervision of low-risk first pregnancy on their problem list.  ----------------------------------------------------------------------------------- Patient reports no complaints.   Contractions: Not present. Vag. Bleeding: None.  Movement: Present. Leaking Fluid denies.  ----------------------------------------------------------------------------------- The following portions of the patient's history were reviewed and updated as appropriate: allergies, current medications, past family history, past medical history, past social history, past surgical history and problem list. Problem list updated.  Objective  Blood pressure 121/82, weight 195 lb (88.5 kg), last menstrual period 07/23/2020. Pregravid weight 154 lb (69.9 kg) Total Weight Gain 41 lb (18.6 kg) Urinalysis: Urine Protein    Urine Glucose    Fetal Status: Fetal Heart Rate (bpm): 155 Fundal Height: 39 cm Movement: Present  Presentation: Vertex  General:  Alert, oriented and cooperative. Patient is in no acute distress.  Skin: Skin is warm and dry. No rash noted.   Cardiovascular: Normal heart rate noted  Respiratory: Normal respiratory effort, no problems with respiration noted  Abdomen: Soft, gravid, appropriate for gestational age. Pain/Pressure: Absent     Pelvic:  Cervical exam deferred        Extremities: Normal range of motion.     Mental Status: Normal mood and affect. Normal behavior. Normal judgment and thought content.   Assessment   30 y.o. G1P0000 at [redacted]w[redacted]d by  04/29/2021, by Last Menstrual Period presenting for routine prenatal visit  Plan   Pregnancy 1 Problems (from 09/18/20 to present)     Problem Noted Resolved   Supervision of low-risk first  pregnancy 10/04/2020 by Conard Novak, MD No   Overview Addendum 02/26/2021 12:03 PM by Conard Novak, MD     Nursing Staff Provider  Office Location  [facility] Dating  LMP = 9 wk [place]  Language  English Anatomy US  complete  Flu Vaccine   Genetic Screen  NIPS: normal xy  TDaP vaccine    Hgb A1C or  GTT Early :hgba1c 5.2, 3h gtt nml (4/4) Third trimester : 2h gtt nml (3/3)  Covid vaccinated   LAB RESULTS   Rhogam  Not needed Blood Type O/Positive/-- (04/11 1529)   Feeding Plan  Breast Antibody Negative (04/11 1529)  Contraception  Rubella 7.37 (04/11 1529)  Circumcision  RPR Non Reactive (04/11 1529)   Pediatrician   HBsAg Negative (04/11 1529)   Support Person Ivin Booty HIV Non Reactive (04/11 1529)  Prenatal Classes Discussed Varicella  Immune    GBS  (For PCN allergy, check sensitivities)   BTL Consent     VBAC Consent  Pap  2022 NIL    Hgb Electro      CF      SMA         History of anal fistula           Term labor symptoms and general obstetric precautions including but not limited to vaginal bleeding, contractions, leaking of fluid and fetal movement were reviewed in detail with the patient. Please refer to After Visit Summary for other counseling recommendations.   Return in about 2 weeks (around 05/03/2021) for ROB.   Thomasene Mohair, MD, [facility], [facility] 04/19/2021 3:15 PM

## 2021-04-24 ENCOUNTER — Encounter: Payer: Self-pay | Admitting: Obstetrics and Gynecology

## 2021-04-24 ENCOUNTER — Ambulatory Visit (INDEPENDENT_AMBULATORY_CARE_PROVIDER_SITE_OTHER): Payer: 59 | Admitting: Obstetrics and Gynecology

## 2021-04-24 ENCOUNTER — Other Ambulatory Visit: Payer: Self-pay

## 2021-04-24 VITALS — BP 126/84 | Wt 191.0 lb

## 2021-04-24 DIAGNOSIS — Z3403 Encounter for supervision of normal first pregnancy, third trimester: Secondary | ICD-10-CM

## 2021-04-24 DIAGNOSIS — Z3A39 39 weeks gestation of pregnancy: Secondary | ICD-10-CM

## 2021-04-24 NOTE — Progress Notes (Addendum)
  Routine Prenatal Care Visit  Subjective  Zoe Maldonado is a 30 y.o. G1P0000 at [redacted]w[redacted]d being seen today for ongoing prenatal care.  She is currently monitored for the following issues for this low-risk pregnancy and has Perianal abscess; Fistula, anal; and Supervision of low-risk first pregnancy on their problem list.  ----------------------------------------------------------------------------------- Patient reports no complaints.   Contractions: Not present. Vag. Bleeding: None.  Movement: Present. Leaking Fluid denies.  ----------------------------------------------------------------------------------- The following portions of the patient's history were reviewed and updated as appropriate: allergies, current medications, past family history, past medical history, past social history, past surgical history and problem list. Problem list updated.  Objective  Blood pressure 126/84, weight 191 lb (86.6 kg), last menstrual period 07/23/2020. Pregravid weight 154 lb (69.9 kg) Total Weight Gain 37 lb (16.8 kg) Urinalysis: Urine Protein    Urine Glucose    Fetal Status: Fetal Heart Rate (bpm): 135 Fundal Height: 39 cm Movement: Present  Presentation: Vertex  General:  Alert, oriented and cooperative. Patient is in no acute distress.  Skin: Skin is warm and dry. No rash noted.   Cardiovascular: Normal heart rate noted  Respiratory: Normal respiratory effort, no problems with respiration noted  Abdomen: Soft, gravid, appropriate for gestational age. Pain/Pressure: Absent     Pelvic:  Cervical exam deferred Dilation: 1 Effacement (%): 60 Station: -3  Extremities: Normal range of motion.     Mental Status: Normal mood and affect. Normal behavior. Normal judgment and thought content.   Assessment   30 y.o. G1P0000 at [redacted]w[redacted]d by  04/29/2021, by Last Menstrual Period presenting for routine prenatal visit  Plan   Pregnancy 1 Problems (from 09/18/20 to present)     Problem Noted Resolved    Supervision of low-risk first pregnancy 10/04/2020 by Conard Novak, MD No   Overview Addendum 02/26/2021 12:03 PM by Conard Novak, MD     Nursing Staff Provider  Office Location  [facility] Dating  LMP = 9 wk [place]  Language  English Anatomy US  complete  Flu Vaccine   Genetic Screen  NIPS: normal xy  TDaP vaccine    Hgb A1C or  GTT Early :hgba1c 5.2, 3h gtt nml (4/4) Third trimester : 2h gtt nml (3/3)  Covid vaccinated   LAB RESULTS   Rhogam  Not needed Blood Type O/Positive/-- (04/11 1529)   Feeding Plan  Breast Antibody Negative (04/11 1529)  Contraception  Rubella 7.37 (04/11 1529)  Circumcision  RPR Non Reactive (04/11 1529)   Pediatrician   HBsAg Negative (04/11 1529)   Support Person Ivin Booty HIV Non Reactive (04/11 1529)  Prenatal Classes Discussed Varicella  Immune    GBS  (For PCN allergy, check sensitivities)   BTL Consent     VBAC Consent  Pap  2022 NIL    Hgb Electro      CF      SMA         History of anal fistula           Preterm labor symptoms and general obstetric precautions including but not limited to vaginal bleeding, contractions, leaking of fluid and fetal movement were reviewed in detail with the patient. Please refer to After Visit Summary for other counseling recommendations.   Return in about 1 week (around 05/01/2021) for ROB.   Thomasene Mohair, MD, [facility], [facility] 04/24/2021 11:47 AM

## 2021-04-27 ENCOUNTER — Other Ambulatory Visit: Payer: Self-pay

## 2021-04-27 ENCOUNTER — Inpatient Hospital Stay
Admission: EM | Admit: 2021-04-27 | Discharge: 2021-04-29 | DRG: 807 | Disposition: A | Discharge status: Home / Self Care | Payer: 59 | Attending: Obstetrics & Gynecology | Admitting: Obstetrics & Gynecology

## 2021-04-27 ENCOUNTER — Encounter: Payer: Self-pay | Admitting: Obstetrics & Gynecology

## 2021-04-27 DIAGNOSIS — Z3A39 39 weeks gestation of pregnancy: Secondary | ICD-10-CM

## 2021-04-27 DIAGNOSIS — Z3490 Encounter for supervision of normal pregnancy, unspecified, unspecified trimester: Secondary | ICD-10-CM

## 2021-04-27 DIAGNOSIS — Z3403 Encounter for supervision of normal first pregnancy, third trimester: Secondary | ICD-10-CM

## 2021-04-27 DIAGNOSIS — O26893 Other specified pregnancy related conditions, third trimester: Secondary | ICD-10-CM | POA: Diagnosis present

## 2021-04-27 DIAGNOSIS — Z886 Allergy status to analgesic agent status: Secondary | ICD-10-CM | POA: Diagnosis not present

## 2021-04-27 DIAGNOSIS — O4202 Full-term premature rupture of membranes, onset of labor within 24 hours of rupture: Secondary | ICD-10-CM | POA: Diagnosis not present

## 2021-04-27 DIAGNOSIS — Z20822 Contact with and (suspected) exposure to covid-19: Secondary | ICD-10-CM | POA: Diagnosis present

## 2021-04-27 LAB — CBC
HCT: 36.7 % (ref 36.0–46.0)
Hemoglobin: 11.8 g/dL — ABNORMAL LOW (ref 12.0–15.0)
MCH: 29.5 pg (ref 26.0–34.0)
MCHC: 32.2 g/dL (ref 30.0–36.0)
MCV: 91.8 fL (ref 80.0–100.0)
Platelets: 340 10*3/uL (ref 150–400)
RBC: 4 MIL/uL (ref 3.87–5.11)
RDW: 13.9 % (ref 11.5–15.5)
WBC: 12.6 10*3/uL — ABNORMAL HIGH (ref 4.0–10.5)
nRBC: 0 % (ref 0.0–0.2)

## 2021-04-27 LAB — RESP PANEL BY RT-PCR (FLU A&B, COVID) ARPGX2
Influenza A by PCR: NEGATIVE
Influenza B by PCR: NEGATIVE
SARS Coronavirus 2 by RT PCR: NEGATIVE

## 2021-04-27 LAB — RUPTURE OF MEMBRANE (ROM)PLUS: Rom Plus: POSITIVE

## 2021-04-27 LAB — TYPE AND SCREEN
ABO/RH(D): O POS
Antibody Screen: NEGATIVE

## 2021-04-27 LAB — ABO/RH: ABO/RH(D): O POS

## 2021-04-27 MED ORDER — LIDOCAINE HCL (PF) 1 % IJ SOLN: 30.0000 mL | INTRAMUSCULAR | Status: DC | PRN

## 2021-04-27 MED ORDER — ACETAMINOPHEN 325 MG PO TABS: 650.0000 mg | ORAL_TABLET | ORAL | Status: DC | PRN

## 2021-04-27 MED ORDER — OXYTOCIN-SODIUM CHLORIDE 30-0.9 UT/500ML-% IV SOLN: 2.5000 [IU]/h | INTRAVENOUS | Status: DC

## 2021-04-27 MED ORDER — LACTATED RINGERS IV SOLN: INTRAVENOUS | Status: DC

## 2021-04-27 MED ORDER — OXYTOCIN BOLUS FROM INFUSION
333.0000 mL | Freq: Once | INTRAVENOUS | Status: AC
  Administered 2021-04-28: 333 mL via INTRAVENOUS

## 2021-04-27 MED ORDER — ONDANSETRON HCL 4 MG/2ML IJ SOLN: 4.0000 mg | Freq: Four times a day (QID) | INTRAMUSCULAR | Status: DC | PRN

## 2021-04-27 MED ORDER — LACTATED RINGERS IV SOLN: 500.0000 mL | INTRAVENOUS | Status: DC | PRN

## 2021-04-27 MED ORDER — TERBUTALINE SULFATE 1 MG/ML IJ SOLN: 0.2500 mg | Freq: Once | INTRAMUSCULAR | Status: DC | PRN

## 2021-04-27 MED ORDER — OXYTOCIN-SODIUM CHLORIDE 30-0.9 UT/500ML-% IV SOLN
1.0000 m[IU]/min | INTRAVENOUS | Status: DC
  Administered 2021-04-28: 2 m[IU]/min via INTRAVENOUS
  Filled 2021-04-27: qty 500

## 2021-04-27 MED ORDER — BUTORPHANOL TARTRATE 1 MG/ML IJ SOLN: 1.0000 mg | INTRAMUSCULAR | Status: DC | PRN

## 2021-04-27 NOTE — H&P (Signed)
Obstetrics Admission History & Physical   No chief complaint on file.   HPI:  30 y.o. G1P0000 @ [redacted]w[redacted]d (04/29/2021, by Last Menstrual Period). 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  No problems this pregnancy.  Prior h/o anal fistula repair.  GBS Neg.  Plans to breast feed.  Prenatal care at: at [facility]. Pregnancy complicated by none.  ROS: A review of systems was performed and negative, except as stated in the above HPI. PMHx:  Past Medical History:  Diagnosis Date   Asthma    as a child   PSHx:  Past Surgical History:  Procedure Laterality Date   Nexplanon  2016   Medications:  Medications Prior to Admission  Medication Sig Dispense Refill Last Dose   Bacillus Coagulans-Inulin (PROBIOTIC) 1-250 BILLION-MG CAPS Take by mouth.   04/27/2021   Prenatal Vit-Fe Fumarate-FA (PRENATAL MULTIVITAMIN) TABS tablet Take 1 tablet by mouth daily at 12 noon.   04/27/2021   Allergies: is allergic to aspirin and latex. OBHx:  OB History  Gravida Para Term Preterm AB Living  1 0 0 0 0 0  SAB IAB Ectopic Multiple Live Births  0 0 0 0 0    # Outcome Date GA Lbr Len/2nd Weight Sex Delivery Anes PTL Lv  1 Current            ZOX:[id number] except as detailed in HPI.Marland Kitchen  No family history of birth defects. Soc Hx: Alcohol: none and Recreational drug use: none  Objective:   Vitals:   04/27/21 1933  BP: 127/89  Pulse: (!) 114  Resp: 18  Temp: 98.3 F (36.8 C)   Constitutional: Well nourished, well developed female in no acute distress.  HEENT: normal Skin: Warm and dry.  Cardiovascular:Regular rate and rhythm.   Extremity: trace to 1+ bilateral pedal edema Respiratory: Clear to auscultation bilateral. Normal respiratory  effort Abdomen: gravid, ND, FHT present, mild tenderness on exam Back: no CVAT Neuro: DTRs 2+, Cranial nerves grossly intact Psych: Alert and Oriented x3. No memory deficits. Normal mood and affect.  MS: normal gait, normal bilateral lower extremity ROM/strength/stability.  Pelvic exam: is not limited by body habitus EGBUS: within normal limits Vagina: within normal limits and with normal mucosa Cervix: 1/50/-3, Vtx Uterus: Uterus demonstrates irritability pattern.  Adnexa: not evaluated  EFM:FHR: 140 bpm, variability: moderate,  accelerations:  Present,  decelerations:  Absent Toco: None   Perinatal info:  Blood type: O positive Rubella- Immune Varicella -Immune TDaP tetanus status unknown to the patient RPR NR / HIV Neg/ HBsAg Neg   Assessment & Plan:   30 y.o. G1P0000 @ [redacted]w[redacted]d, Admitted on 04/27/2021:Early labor w SROM    Admit for labor, Observe for cervical change, Fetal Wellbeing Reassuring, and Epidural when ready Pitocin if prolonged ROM is encountered  Annamarie Major, MD, [facility], [facility] 04/27/2021  9:02 PM

## 2021-04-27 NOTE — OB Triage Note (Signed)
Pt arrives G1P0 with c/o fluid leaking since 1850. Pt denies ctx's at this time. Pt denies any other pain at this time.

## 2021-04-28 ENCOUNTER — Inpatient Hospital Stay: Payer: 59 | Admitting: Anesthesiology

## 2021-04-28 ENCOUNTER — Encounter: Payer: Self-pay | Admitting: Obstetrics & Gynecology

## 2021-04-28 DIAGNOSIS — O4202 Full-term premature rupture of membranes, onset of labor within 24 hours of rupture: Secondary | ICD-10-CM

## 2021-04-28 DIAGNOSIS — Z3A39 39 weeks gestation of pregnancy: Secondary | ICD-10-CM

## 2021-04-28 LAB — SYPHILIS: RPR W/REFLEX TO RPR TITER AND TREPONEMAL ANTIBODIES, TRADITIONAL SCREENING AND DIAGNOSIS ALGORITHM: RPR Ser Ql: NONREACTIVE

## 2021-04-28 MED ORDER — SENNOSIDES-DOCUSATE SODIUM 8.6-50 MG PO TABS
2.0000 | ORAL_TABLET | ORAL | Status: DC
  Administered 2021-04-28 – 2021-04-29 (×2): 2 via ORAL
  Filled 2021-04-28 (×2): qty 2

## 2021-04-28 MED ORDER — FENTANYL-BUPIVACAINE-NACL 0.5-0.125-0.9 MG/250ML-% EP SOLN: 12.0000 mL/h | EPIDURAL | Status: DC | PRN

## 2021-04-28 MED ORDER — DIPHENHYDRAMINE HCL 50 MG/ML IJ SOLN: 12.5000 mg | INTRAMUSCULAR | Status: DC | PRN

## 2021-04-28 MED ORDER — OXYCODONE-ACETAMINOPHEN 5-325 MG PO TABS: 1.0000 | ORAL_TABLET | ORAL | Status: DC | PRN

## 2021-04-28 MED ORDER — LACTATED RINGERS IV SOLN
500.0000 mL | Freq: Once | INTRAVENOUS | Status: AC
  Administered 2021-04-28: 500 mL via INTRAVENOUS

## 2021-04-28 MED ORDER — DIBUCAINE (PERIANAL) 1 % EX OINT
1.0000 "application " | TOPICAL_OINTMENT | CUTANEOUS | Status: DC | PRN
  Administered 2021-04-28: 1 via RECTAL
  Filled 2021-04-28 (×2): qty 28

## 2021-04-28 MED ORDER — LIDOCAINE HCL (PF) 1 % IJ SOLN
INTRAMUSCULAR | Status: DC | PRN
  Administered 2021-04-28: 4 mL via SUBCUTANEOUS

## 2021-04-28 MED ORDER — COCONUT OIL OIL
1.0000 "application " | TOPICAL_OIL | Status: DC | PRN
  Administered 2021-04-28: 1 via TOPICAL
  Filled 2021-04-28: qty 120

## 2021-04-28 MED ORDER — ONDANSETRON HCL 4 MG PO TABS
4.0000 mg | ORAL_TABLET | ORAL | Status: DC | PRN
  Filled 2021-04-28: qty 1

## 2021-04-28 MED ORDER — SODIUM CHLORIDE 0.9 % IV SOLN
INTRAVENOUS | Status: DC | PRN
  Administered 2021-04-28 (×2): 5 mL via EPIDURAL

## 2021-04-28 MED ORDER — FENTANYL-BUPIVACAINE-NACL 0.5-0.125-0.9 MG/250ML-% EP SOLN
EPIDURAL | Status: AC
  Filled 2021-04-28: qty 250

## 2021-04-28 MED ORDER — WITCH HAZEL-GLYCERIN EX PADS
1.0000 "application " | MEDICATED_PAD | CUTANEOUS | Status: DC | PRN
  Administered 2021-04-28: 1 via TOPICAL
  Filled 2021-04-28 (×2): qty 100

## 2021-04-28 MED ORDER — IBUPROFEN 600 MG PO TABS
600.0000 mg | ORAL_TABLET | Freq: Four times a day (QID) | ORAL | Status: DC
  Administered 2021-04-28 – 2021-04-29 (×5): 600 mg via ORAL
  Filled 2021-04-28 (×6): qty 1

## 2021-04-28 MED ORDER — PRENATAL MULTIVITAMIN CH
1.0000 | ORAL_TABLET | Freq: Every day | ORAL | Status: DC
  Administered 2021-04-28 – 2021-04-29 (×2): 1 via ORAL
  Filled 2021-04-28 (×2): qty 1

## 2021-04-28 MED ORDER — DIPHENHYDRAMINE HCL 25 MG PO CAPS: 25.0000 mg | ORAL_CAPSULE | Freq: Four times a day (QID) | ORAL | Status: DC | PRN

## 2021-04-28 MED ORDER — FENTANYL-BUPIVACAINE-NACL 0.5-0.125-0.9 MG/250ML-% EP SOLN
EPIDURAL | Status: DC | PRN
  Administered 2021-04-28: 12 mL/h via EPIDURAL

## 2021-04-28 MED ORDER — ONDANSETRON HCL 4 MG/2ML IJ SOLN: 4.0000 mg | INTRAMUSCULAR | Status: DC | PRN

## 2021-04-28 MED ORDER — EPHEDRINE 5 MG/ML INJ: 10.0000 mg | INTRAVENOUS | Status: DC | PRN

## 2021-04-28 MED ORDER — PHENYLEPHRINE 40 MCG/ML (10ML) SYRINGE FOR IV PUSH (FOR BLOOD PRESSURE SUPPORT): 80.0000 ug | PREFILLED_SYRINGE | INTRAVENOUS | Status: DC | PRN

## 2021-04-28 MED ORDER — OXYCODONE-ACETAMINOPHEN 5-325 MG PO TABS: 2.0000 | ORAL_TABLET | ORAL | Status: DC | PRN

## 2021-04-28 MED ORDER — ACETAMINOPHEN 325 MG PO TABS
650.0000 mg | ORAL_TABLET | ORAL | Status: DC | PRN
  Administered 2021-04-28: 650 mg via ORAL
  Filled 2021-04-28: qty 2

## 2021-04-28 MED ORDER — LIDOCAINE-EPINEPHRINE (PF) 1.5 %-1:200000 IJ SOLN
INTRAMUSCULAR | Status: DC | PRN
  Administered 2021-04-28: 3 mL via EPIDURAL

## 2021-04-28 MED ORDER — BENZOCAINE-MENTHOL 20-0.5 % EX AERO
1.0000 "application " | INHALATION_SPRAY | CUTANEOUS | Status: DC | PRN
  Administered 2021-04-28: 1 via TOPICAL
  Filled 2021-04-28 (×2): qty 56

## 2021-04-28 MED ORDER — SIMETHICONE 80 MG PO CHEW: 80.0000 mg | CHEWABLE_TABLET | ORAL | Status: DC | PRN

## 2021-04-28 NOTE — Progress Notes (Signed)
Zoe Maldonado is stable after delivery. Pt's support person Sharia Reeve and mother is at bedside. Pt bonding with infant and performing skin to skin after delivery. Epidural catheter removed by RN, tip intact, no bleeding noted at site. Pt is stable and ambulated to the bathroom, voided a sufficient amount, and tolerated activity well. Pt ambulated to the wheelchair and transferred to mother/baby unit RM [id number]  for couplet care. Report given to Vickie Epley

## 2021-04-28 NOTE — Anesthesia Procedure Notes (Signed)
Epidural Patient location during procedure: OB Start time: 04/28/2021 4:49 AM End time: 04/28/2021 4:55 AM  Staffing Anesthesiologist: Lenard Simmer, MD Performed: anesthesiologist   Preanesthetic Checklist Completed: patient identified, IV checked, site marked, risks and benefits discussed, surgical consent, monitors and equipment checked, pre-op evaluation and timeout performed  Epidural Patient position: sitting Prep: ChloraPrep Patient monitoring: heart rate, continuous pulse ox and blood pressure Approach: midline Location: L3-L4 Injection technique: LOR saline  Needle:  Needle type: Tuohy  Needle gauge: 17 G Needle length: 9 cm and 9 Needle insertion depth: 6 cm Catheter type: closed end flexible Catheter size: 19 Gauge Catheter at skin depth: 11 cm Test dose: negative and 1.5% lidocaine with Epi 1:200 K  Assessment Sensory level: T10 Events: blood not aspirated, injection not painful, no injection resistance, no paresthesia and negative IV test  Additional Notes 1st attempt Pt. Evaluated and documentation done after procedure finished. Patient identified. Risks/Benefits/Options discussed with patient including but not limited to bleeding, infection, nerve damage, paralysis, failed block, incomplete pain control, headache, blood pressure changes, nausea, vomiting, reactions to medication both or allergic, itching and postpartum back pain. Confirmed with bedside nurse the patient's most recent platelet count. Confirmed with patient that they are not currently taking any anticoagulation, have any bleeding history or any family history of bleeding disorders. Patient expressed understanding and wished to proceed. All questions were answered. Sterile technique was used throughout the entire procedure. Please see nursing notes for vital signs. Test dose was given through epidural catheter and negative prior to continuing to dose epidural or start infusion. Warning signs of high  block given to the patient including shortness of breath, tingling/numbness in hands, complete motor block, or any concerning symptoms with instructions to call for help. Patient was given instructions on fall risk and not to get out of bed. All questions and concerns addressed with instructions to call with any issues or inadequate analgesia.    Patient tolerated the insertion well without immediate complications.Reason for block:procedure for pain

## 2021-04-28 NOTE — Anesthesia Preprocedure Evaluation (Signed)
Anesthesia Evaluation  Patient identified by MRN, date of birth, ID band Patient awake    Reviewed: Allergy & Precautions, H&P , NPO status , Patient's Chart, lab work & pertinent test results, reviewed documented beta blocker date and time   History of Anesthesia Complications Negative for: history of anesthetic complications  Airway Mallampati: II  TM Distance: >3 FB Neck ROM: full    Dental no notable dental hx.    Pulmonary neg shortness of breath, asthma (as a child) , neg recent URI, former smoker,    Pulmonary exam normal breath sounds clear to auscultation       Cardiovascular Exercise Tolerance: Good negative cardio ROS Normal cardiovascular exam Rhythm:regular Rate:Normal     Neuro/Psych negative neurological ROS  negative psych ROS   GI/Hepatic Neg liver ROS, GERD  ,  Endo/Other  negative endocrine ROS  Renal/GU negative Renal ROS  negative genitourinary   Musculoskeletal   Abdominal   Peds  Hematology negative hematology ROS (+)   Anesthesia Other Findings Past Medical History: No date: Asthma     Comment:  as a child   Reproductive/Obstetrics (+) Pregnancy                             Anesthesia Physical Anesthesia Plan  ASA: 2  Anesthesia Plan: Epidural   Post-op Pain Management:    Induction:   PONV Risk Score and Plan:   Airway Management Planned:   Additional Equipment:   Intra-op Plan:   Post-operative Plan:   Informed Consent: I have reviewed the patients History and Physical, chart, labs and discussed the procedure including the risks, benefits and alternatives for the proposed anesthesia with the patient or authorized representative who has indicated his/her understanding and acceptance.     Dental Advisory Given  Plan Discussed with: Anesthesiologist, CRNA and Surgeon  Anesthesia Plan Comments:         Anesthesia Quick Evaluation

## 2021-04-28 NOTE — Discharge Summary (Signed)
OB Discharge Summary     Patient Name: Zoe Maldonado DOB: 01-14-1991 MRN: [id number]  Date of admission: 04/27/2021 Delivering MD: Vena Austria   Date of discharge: 04/29/2021  Admitting diagnosis: Pregnancy [Z34.90] Normal labor and delivery [O80] Intrauterine pregnancy: [redacted]w[redacted]d     Secondary diagnosis:  Principal Problem:   Normal labor and delivery Active Problems:   Pregnancy  Additional problems: None     Discharge diagnosis: Term Pregnancy Delivered                                                                                                Post partum procedures: none  Augmentation: Pitocin  Complications: None  Hospital course:  Onset of Labor With Vaginal Delivery      30 y.o. yo G1P0000 at [redacted]w[redacted]d was admitted in Latent Labor on 04/27/2021. Patient had an uncomplicated labor course as follows:  Membrane Rupture Time/Date: 6:50 PM ,04/27/2021   Delivery Method:Vaginal, Spontaneous  Episiotomy: None  Lacerations:  2nd degree;Perineal  Patient had an uncomplicated postpartum course.  She is ambulating, tolerating a regular diet, passing flatus, and urinating well. Patient is discharged home in stable condition on 04/29/21.  Newborn Data: Birth date:04/28/2021  Birth time:9:32 AM  Gender:Female  Living status:Living  Apgars:8 ,9  Weight:3610 g   Physical exam  Vitals:   04/28/21 1635 04/28/21 1936 04/28/21 2323 04/29/21 0757  BP: 121/83 136/72 132/73 125/86  Pulse: (!) 105 (!) 104 (!) 106 99  Resp:  18 18 18   Temp: (!) 97.5 F (36.4 C) 98.2 F (36.8 C) 98.2 F (36.8 C) 98 F (36.7 C)  TempSrc: Oral Oral Oral Oral  SpO2: 97% 100% 99% 99%  Weight:      Height:       General: alert Lochia: appropriate Uterine Fundus: firm  DVT Evaluation: No evidence of DVT seen on physical exam. Labs: Lab Results  Component Value Date   WBC 15.2 (H) 04/29/2021   HGB 9.7 (L) 04/29/2021   HCT 29.8 (L) 04/29/2021   MCV 90.0 04/29/2021   PLT 254  04/29/2021   CMP Latest Ref Rng & Units 11/14/2017  Glucose 65 - 99 mg/dL 01/14/2018)  BUN 6 - 20 mg/dL 10  Creatinine 528(U - 1.32 mg/dL 4.40  Sodium 1.02 - 725 mmol/L 135  Potassium 3.5 - 5.1 mmol/L 3.5  Chloride 101 - 111 mmol/L 103  CO2 22 - 32 mmol/L 23  Calcium 8.9 - 10.3 mg/dL 9.2  Total Protein 6.5 - 8.1 g/dL -  Total Bilirubin 0.3 - 1.2 mg/dL -  Alkaline Phos 38 - 366 U/L -  AST 15 - 41 U/L -  ALT 14 - 54 U/L -    Discharge instruction: per After Visit Summary and "Baby and Me Booklet".  After visit meds:  Allergies as of 04/29/2021       Reactions   Aspirin Swelling   Latex         Medication List     STOP taking these medications    prenatal multivitamin Tabs tablet   Probiotic 1-250 BILLION-MG Caps  TAKE these medications    ibuprofen 600 MG tablet Commonly known as: ADVIL Take 1 tablet (600 mg total) by mouth every 6 (six) hours.        Diet: routine diet  Activity: Advance as tolerated. Pelvic rest for 6 weeks.   Outpatient follow up:6 weeks Follow up Appt: Future Appointments  Date Time Provider Department Center  04/30/2021  4:30 PM Conard Novak, MD [facility] None   Follow up Visit:No follow-ups on file.  Postpartum contraception: None  Newborn Data: Live born female  Birth Weight:   APGAR: 8, 9  Newborn Delivery   Birth date/time: 04/28/2021 09:32:00 Delivery type: Vaginal, Spontaneous      Baby Feeding: Breast Disposition:home with mother   04/29/2021 Vena Austria, MD

## 2021-04-28 NOTE — Progress Notes (Signed)
  Labor Progress Note   30 y.o. G1P0000 @ [redacted]w[redacted]d , admitted for  Pregnancy, Labor Management.   Subjective:  SROM since 24401 yesterday Comfy w epidural  Objective:  BP (!) 126/58 (BP Location: Left Arm)   Pulse 92   Temp 97.7 F (36.5 C) (Oral)   Resp 18   Ht 5\' 3"  (1.6 m)   Wt 90.3 kg   LMP 07/23/2020   SpO2 99%   BMI 35.25 kg/m  Abd: gravid, ND, FHT present, mild tenderness on exam Extr: trace to 1+ bilateral pedal edema SVE: C/C/+1  EFM: FHR: 140 bpm, variability: mod,  accelerations:  Present,  decelerations:  Absent Toco: Frequency: Every 3-4 minutes Labs: I have reviewed the patient's lab results.   Assessment & Plan:  G1P0000 @ [redacted]w[redacted]d, admitted for  Pregnancy and Labor/Delivery Management  1. Pain management: epidural. 2. FWB: FHT category 1.  3. ID: GBS negative 4. Labor management: Second stage On Pitocin  All discussed with patient, see orders  [redacted]w[redacted]d, MD, Annamarie Major Ob/Gyn, [facility] 04/28/2021  7:38 AM

## 2021-04-29 DIAGNOSIS — Z3A39 39 weeks gestation of pregnancy: Secondary | ICD-10-CM | POA: Diagnosis not present

## 2021-04-29 DIAGNOSIS — O4202 Full-term premature rupture of membranes, onset of labor within 24 hours of rupture: Secondary | ICD-10-CM | POA: Diagnosis not present

## 2021-04-29 LAB — CBC
HCT: 29.8 % — ABNORMAL LOW (ref 36.0–46.0)
Hemoglobin: 9.7 g/dL — ABNORMAL LOW (ref 12.0–15.0)
MCH: 29.3 pg (ref 26.0–34.0)
MCHC: 32.6 g/dL (ref 30.0–36.0)
MCV: 90 fL (ref 80.0–100.0)
Platelets: 254 10*3/uL (ref 150–400)
RBC: 3.31 MIL/uL — ABNORMAL LOW (ref 3.87–5.11)
RDW: 14 % (ref 11.5–15.5)
WBC: 15.2 10*3/uL — ABNORMAL HIGH (ref 4.0–10.5)
nRBC: 0 % (ref 0.0–0.2)

## 2021-04-29 MED ORDER — IBUPROFEN 600 MG PO TABS: 600.0000 mg | ORAL_TABLET | Freq: Four times a day (QID) | ORAL | 0 refills | Status: DC

## 2021-04-29 NOTE — Progress Notes (Signed)
Pt discharged with infant. Discharge instructions, prescriptions, and follow up appointments given to and reviewed with patient. Pt verbalized understanding. Escorted out by auxillary.  

## 2021-04-29 NOTE — Lactation Note (Signed)
This note was copied from a baby's chart. Lactation Consultation Note  Patient Name: Zoe Maldonado IRCVE'L Date: 04/29/2021 Reason for consult: Follow-up assessment Age:29 hours Lactation Rounds: LC to the room for a visit. Mother and baby are resting and will call with next feed. LC to the room baby is crying. CNA is attempting hearing screen. LC encouraged latching baby for him to eat and be settled for screening. Baby was positioned in cross cradle on the left. He has rhythmic sucking pattern and several swallows, discussed we kept him swaddled due to screening but would not encourage it otherwise at breast. Mother states feeds are going well but her nipples are flat and painful after and during feeds. Reviewed signs of a shallow versus deeper latch. LC reviewed and encouraged feeding on demand and with cues. If baby is not cueing we encourage hand expression and spoon feed to wake baby. Reviewed diaper counts for days of life and when to call Peds with questions. Reviewed "understanding Postpartum and Newborn care " booklet at bedside. Reviewed outpatient Lactation number and resources.  LC assisted with football on the right after screening. Nipple was very round after feed. Noted small amount of clicking heard on right side.  Reviewed pacifier, pumping, and bottles are not encouraged until breastfeeding is established and going well in the first 4 weeks unless Peds encouraged bottle feed for weight gain. Parents stated understanding with all teaching.   Maternal Data Has patient been taught Hand Expression?: Yes Does the patient have breastfeeding experience prior to this delivery?: No  Feeding Mother's Current Feeding Choice: Breast Milk  LATCH Score Latch: Grasps breast easily, tongue down, lips flanged, rhythmical sucking.  Audible Swallowing: A few with stimulation  Type of Nipple: Everted at rest and after stimulation  Comfort (Breast/Nipple): Filling, red/small  blisters or bruises, mild/mod discomfort  Hold (Positioning): Assistance needed to correctly position infant at breast and maintain latch.  LATCH Score: 7   Lactation Tools Discussed/Used  Reviewed pacifier use  Interventions Interventions: Breast feeding basics reviewed;Assisted with latch;Skin to skin;Adjust position;Support pillows;Position options;Education  Discharge Discharge Education: Engorgement and breast care;Warning signs for feeding baby Pump: Personal (has a Spectra at home)  Consult Status Consult Status: PRN    Lee-Anne Flicker D Jaquaveon Bilal 04/29/2021, 11:25 AM

## 2021-04-29 NOTE — Anesthesia Postprocedure Evaluation (Signed)
Anesthesia Post Note  Patient: Zoe Maldonado  Procedure(s) Performed: AN AD HOC LABOR EPIDURAL  Patient location during evaluation: Mother Baby Anesthesia Type: Epidural Level of consciousness: awake and alert Pain management: pain level controlled Vital Signs Assessment: post-procedure vital signs reviewed and stable Respiratory status: spontaneous breathing, nonlabored ventilation and respiratory function stable Cardiovascular status: stable Postop Assessment: no headache, no backache and able to ambulate Anesthetic complications: no   No notable events documented.   Last Vitals:  Vitals:   04/28/21 1936 04/28/21 2323  BP: 136/72 132/73  Pulse: (!) 104 (!) 106  Resp: 18 18  Temp: 36.8 C 36.8 C  SpO2: 100% 99%    Last Pain:  Vitals:   04/28/21 2323  TempSrc: Oral  PainSc:                  Reed Breech

## 2021-04-30 ENCOUNTER — Encounter: Payer: 59 | Admitting: Obstetrics and Gynecology

## 2021-06-14 ENCOUNTER — Other Ambulatory Visit: Payer: Self-pay

## 2021-06-14 ENCOUNTER — Ambulatory Visit (INDEPENDENT_AMBULATORY_CARE_PROVIDER_SITE_OTHER): Payer: 59 | Admitting: Advanced Practice Midwife

## 2021-06-14 ENCOUNTER — Encounter: Payer: Self-pay | Admitting: Advanced Practice Midwife

## 2021-06-14 NOTE — Progress Notes (Signed)
Postpartum Visit  Chief Complaint:  Chief Complaint  Patient presents with   Post-op Follow-up    6 wk postpartum - off and on stomach cramping. RM 3    History of Present Illness: Patient is a 31 y.o. G1P1001 presents for postpartum visit.  Review the Delivery Report for details.  Date of delivery: 04/29/2021 Type of delivery: Vaginal delivery - Vacuum or forceps assisted  no Episiotomy No.  Laceration: 2nd degree  Pregnancy or labor problems:  no Any problems since the delivery:  no  Newborn Details:  SINGLETON :  1. BabyGender female. Birth weight: 7 pounds 15 ounces Maternal Details:  Breast or formula feeding: breastfeeding Intercourse: No  Contraception after delivery:  pull out method Any bowel or bladder issues: No  Post partum depression/anxiety noted:  no Edinburgh Post-Partum Depression Score:6 Date of last PAP: 09/18/20  no abnormalities   Review of Systems: Review of Systems  Constitutional:  Negative for chills and fever.  HENT:  Negative for congestion, ear discharge, ear pain, hearing loss, sinus pain and sore throat.   Eyes:  Negative for blurred vision and double vision.  Respiratory:  Negative for cough, shortness of breath and wheezing.   Cardiovascular:  Negative for chest pain, palpitations and leg swelling.  Gastrointestinal:  Negative for abdominal pain, blood in stool, constipation, diarrhea, heartburn, melena, nausea and vomiting.  Genitourinary:  Negative for dysuria, flank pain, frequency, hematuria and urgency.  Musculoskeletal:  Negative for back pain, joint pain and myalgias.  Skin:  Negative for itching and rash.  Neurological:  Negative for dizziness, tingling, tremors, sensory change, speech change, focal weakness, seizures, loss of consciousness, weakness and headaches.  Endo/Heme/Allergies:  Negative for environmental allergies. Does not bruise/bleed easily.  Psychiatric/Behavioral:  Negative for depression, hallucinations, memory  loss, substance abuse and suicidal ideas. The patient is not nervous/anxious and does not have insomnia.      Past Medical History:  Past Medical History:  Diagnosis Date   Asthma    as a child    Past Surgical History:  Past Surgical History:  Procedure Laterality Date   Nexplanon  2016    Family History:  Family History  Problem Relation Age of Onset   Arthritis Mother    ADD / ADHD Brother    Asthma Brother    Diabetes Maternal Aunt     Social History:  Social History   Socioeconomic History   Marital status: Married    Spouse name: Not on file   Number of children: Not on file   Years of education: Not on file   Highest education level: Not on file  Occupational History   Not on file  Tobacco Use   Smoking status: Former   Smokeless tobacco: Never  Vaping Use   Vaping Use: Some days  Substance and Sexual Activity   Alcohol use: Yes    Comment: Rarely   Drug use: Yes    Types: Marijuana, Other-see comments    Comment: Former, not in three yrs   Sexual activity: Yes    Birth control/protection: None  Other Topics Concern   Not on file  Social History Narrative   Not on file   Social Determinants of Health   Financial Resource Strain: Not on file  Food Insecurity: Not on file  Transportation Needs: Not on file  Physical Activity: Not on file  Stress: Not on file  Social Connections: Not on file  Intimate Partner Violence: Not on file  Allergies:  Allergies  Allergen Reactions   Aspirin Swelling   Latex     Medications: Prior to Admission medications   Medication Sig Start Date End Date Taking? Authorizing Provider  Prenatal Vit-Fe Fumarate-FA (MULTIVITAMIN-PRENATAL) 27-0.8 MG TABS tablet Take 1 tablet by mouth daily at 12 noon.   Yes [provider]    Physical Exam Blood pressure 111/80, height 5\' 3"  (1.6 m), weight 182 lb (82.6 kg), currently breastfeeding.    General: NAD HEENT: normocephalic, anicteric Pulmonary: No  increased work of breathing Abdomen: NABS, soft, non-tender, non-distended.  Umbilicus without lesions.  No hepatomegaly, splenomegaly or masses palpable. No evidence of hernia. Genitourinary:  External: Normal external female genitalia.  Normal urethral meatus, normal  Bartholin's and Skene's glands.    Vagina: Normal vaginal mucosa, no evidence of prolapse.    Cervix: no CMT  Uterus: Non-enlarged, mobile, normal contour.    Adnexa: ovaries non-enlarged, no adnexal masses  Rectal: deferred Extremities: no edema, erythema, or tenderness Neurologic: Grossly intact Psychiatric: mood appropriate, affect full   Edinburgh Postnatal Depression Scale - 06/14/21 1547       Edinburgh Postnatal Depression Scale:  In the Past 7 Days   I have been able to laugh and see the funny side of things. 0    I have looked forward with enjoyment to things. 0    I have blamed myself unnecessarily when things went wrong. 0    I have been anxious or worried for no good reason. 2    I have felt scared or panicky for no good reason. 1    Things have been getting on top of me. 1    I have been so unhappy that I have had difficulty sleeping. 0    I have felt sad or miserable. 1    I have been so unhappy that I have been crying. 1    The thought of harming myself has occurred to me. 0    Edinburgh Postnatal Depression Scale Total 6             Assessment: 31 y.o. G1P1001 presenting for 6 week postpartum visit  Plan: Problem List Items Addressed This Visit   None Visit Diagnoses     6 weeks postpartum follow-up    -  Primary        1) Contraception - Education given regarding options for contraception, as well as compatibility with breast feeding if applicable.  Patient plans on coitus interruptus for contraception. She is aware of natural family planning as well.  2)  Pap - ASCCP guidelines and rational discussed.  Patient opts for every 3 years screening interval  3) Patient underwent  screening for postpartum depression with no signs of depression  4) Return in about 1 year (around 06/14/2022) for annual established gyn.   Rod Can, [facility] 06/14/2021, 4:09 PM

## 2021-12-24 IMAGING — US US OB COMP +14 WK
1 series · 13 of 28 positions shown · non-contrast
Comparison: none

CLINICAL DATA: Second trimester pregnancy for fetal anatomy survey.

EXAM:
OBSTETRICAL ULTRASOUND >14 WKS

[Series 1: us ob comp + 14 wk · 130 acquisitions, 13 frames shown]
[im 5/130]
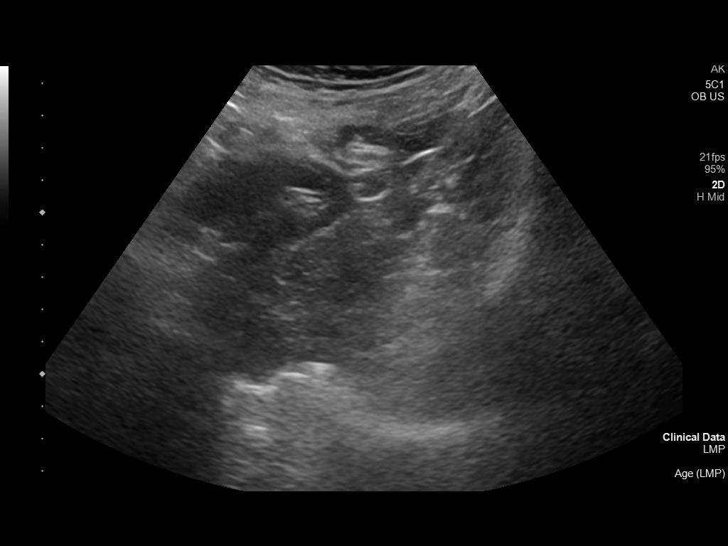
[im 15/130]
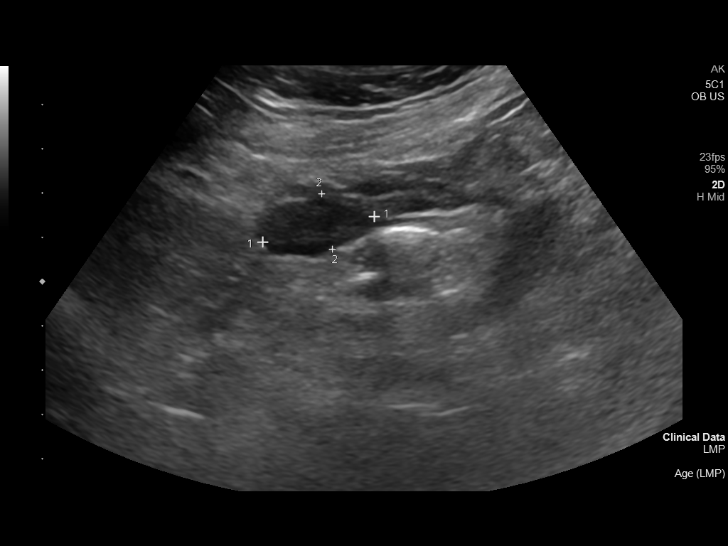
[im 24/130]
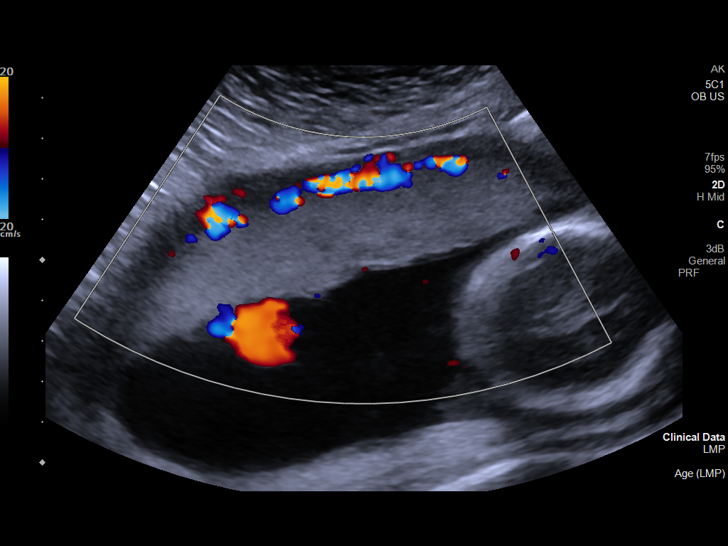
[im 34/130]
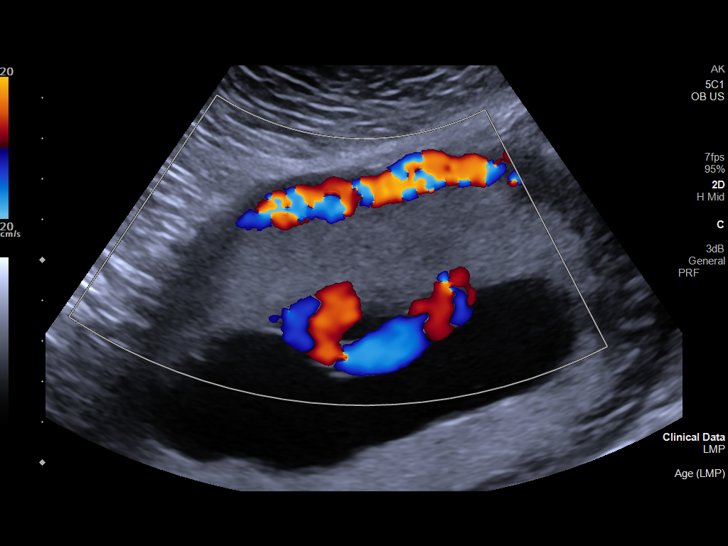
[im 44/130]
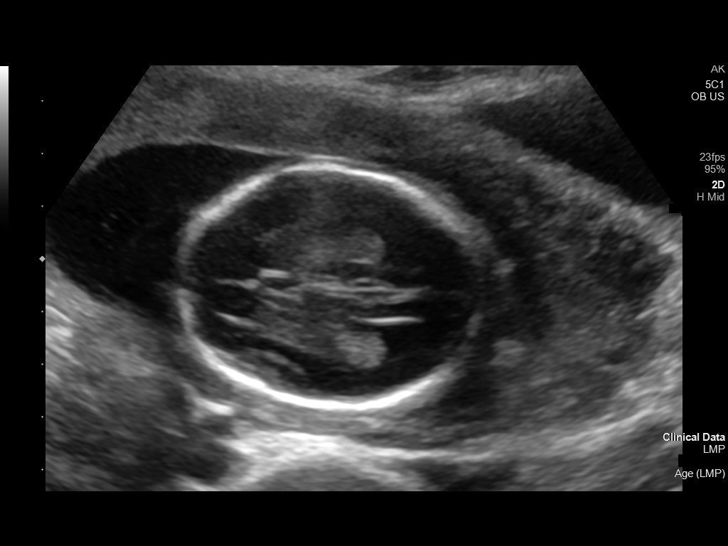
[im 53/130]
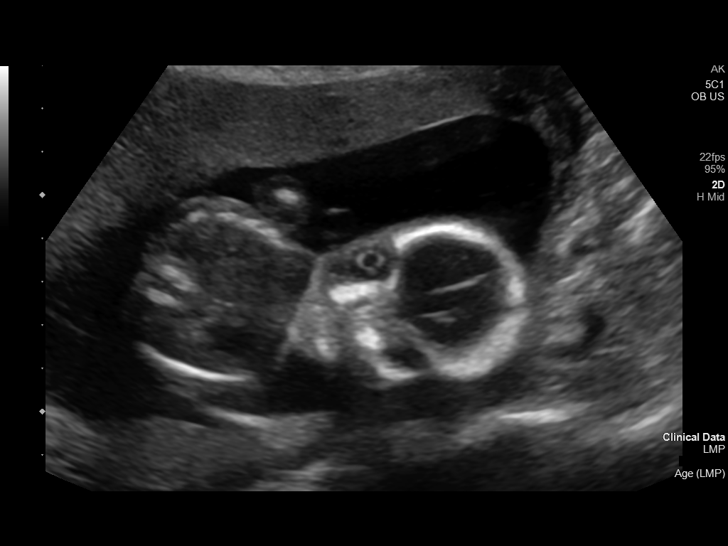
[im 67/130]
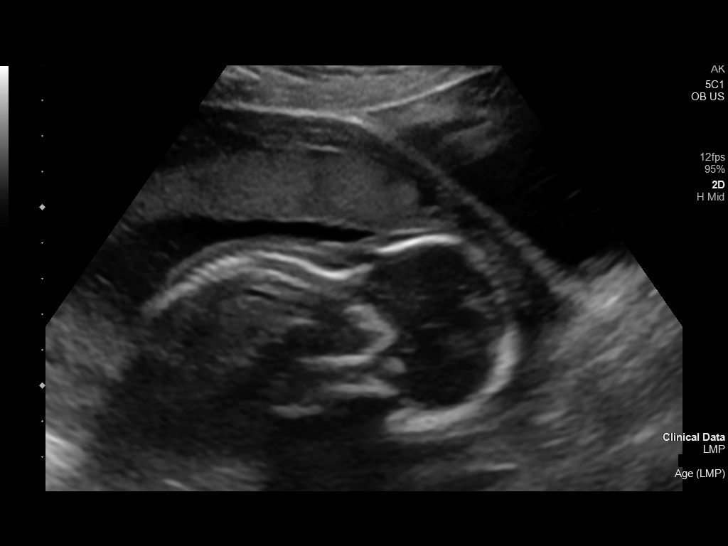
[im 77/130]
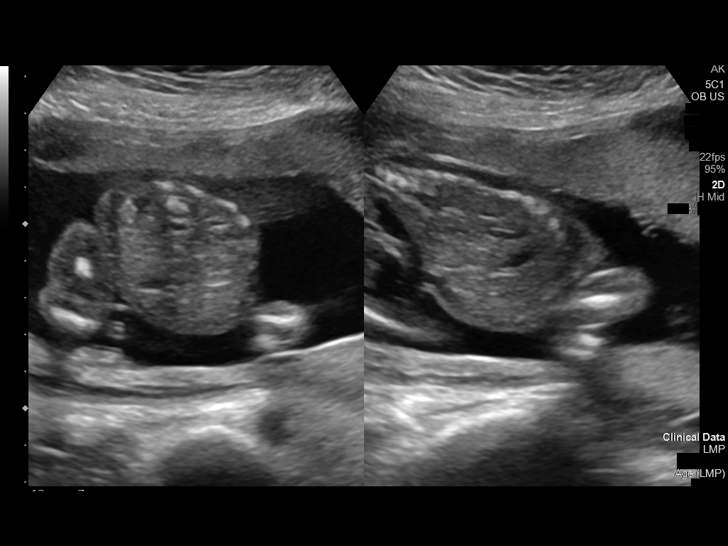
[im 87/130]
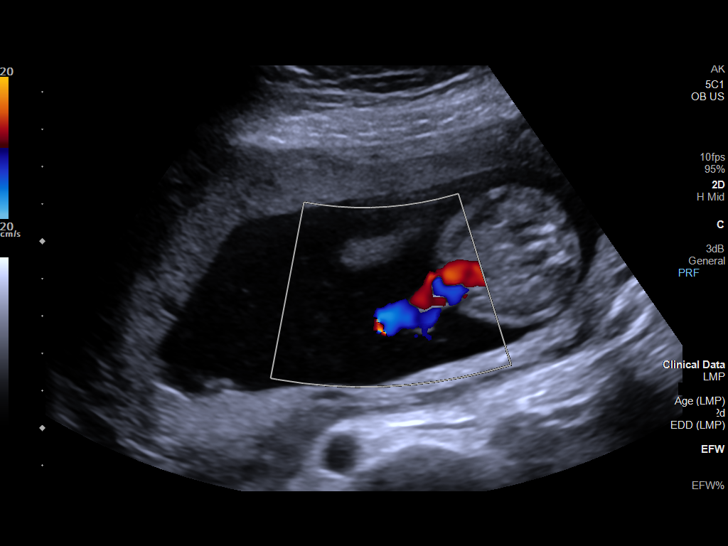
[im 96/130]
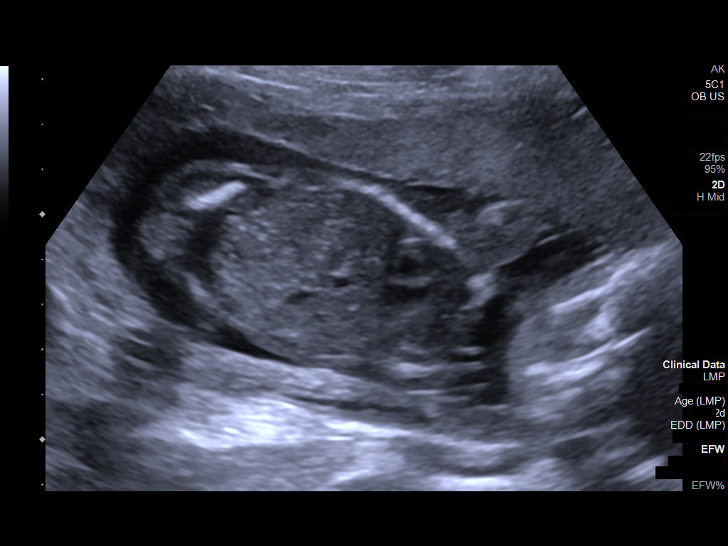
[im 106/130]
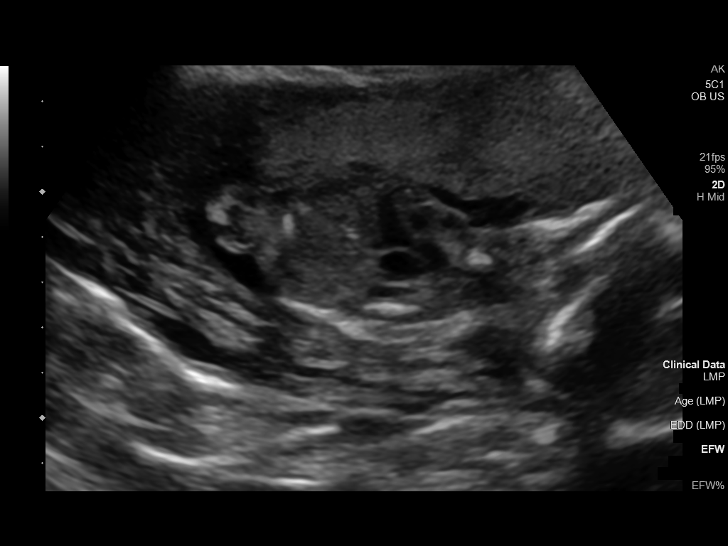
[im 115/130]
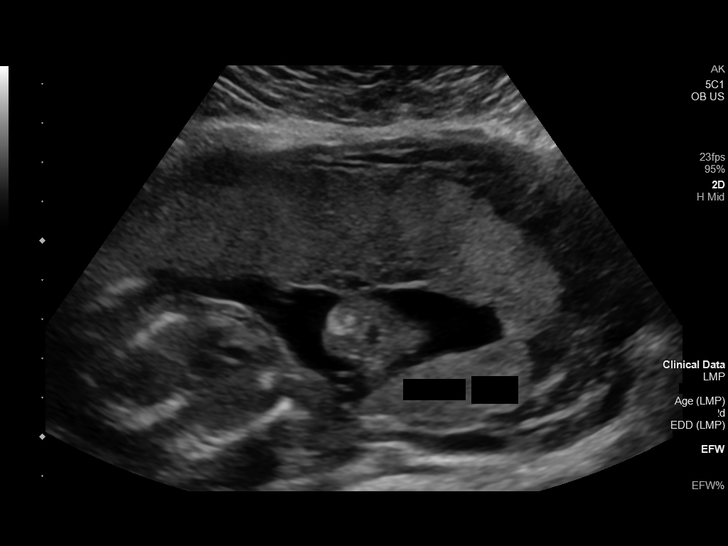
[im 125/130]
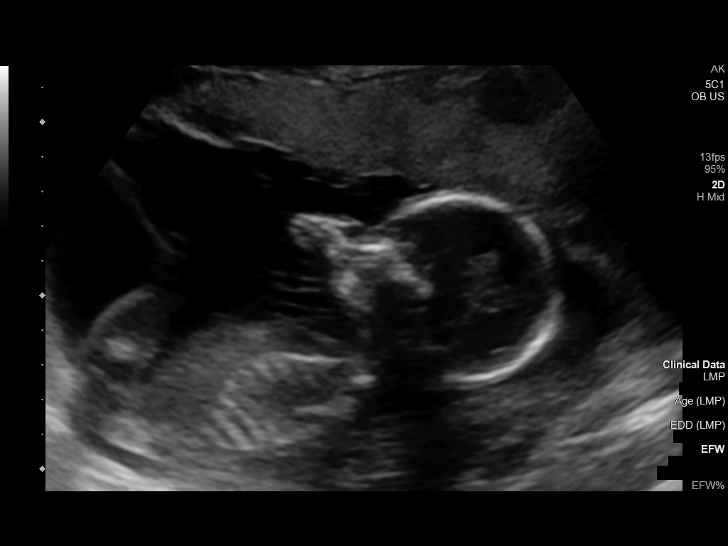

[13 of 28 positions shown; findings below may reference images not displayed]

FINDINGS: Number of Fetuses: 1

Heart Rate:  144 bpm

Movement: Yes

Presentation: Cephalic

Previa: No

Placental Location: Anterior

Amniotic Fluid (Subjective): Within normal limits

Amniotic Fluid (Objective):

Vertical pocket = 3.6cm

FETAL BIOMETRY

BPD: 4.0cm 20w 0d

HC:   16.7cm 19w 3d

AC:   13.9cm 19w 2d

FL:   3.2cm 19w 6d

Current Mean GA: 19w 6d US EDC: 05/02/2021

Assigned GA:  20w 2d Assigned EDC: 04/29/2021

FETAL ANATOMY

Lateral Ventricles: Appears normal

Thalami/CSP: Appears normal

Posterior Fossa:  Appears normal

Nuchal Region: Appears normal   NFT= 3 mm

Upper Lip: Appears normal

Spine: Appears normal

4 Chamber Heart on Left: Appears normal

LVOT: Appears normal

RVOT: Appears normal

Stomach on Left: Appears normal

3 Vessel Cord: Appears normal

Cord Insertion site: Appears normal

Kidneys: Appears normal

Bladder: Appears normal

Extremities: Appears normal

Sex: Male

Maternal Findings:

Cervix:  4.2 cm TA
IMPRESSION: Assigned GA currently 20 weeks 2 days.  Appropriate fetal growth.

Unremarkable fetal anatomic survey.  No fetal anomalies identified.

## 2022-07-31 ENCOUNTER — Ambulatory Visit: Payer: 59 | Admitting: Podiatry

## 2022-07-31 ENCOUNTER — Encounter: Payer: Self-pay | Admitting: Podiatry

## 2022-07-31 DIAGNOSIS — M67471 Ganglion, right ankle and foot: Secondary | ICD-10-CM

## 2022-07-31 NOTE — Progress Notes (Signed)
She presents today chief complaint of ganglion cyst to the right ankle.  She was scheduled for surgery several months ago but had to cancel due to her pregnancy.  She states that she still breast-feeding but is planning on stopping by mid March.  Objective: Vital signs are stable alert and oriented x 3 reviewed her past medical history medications allergies surgeries and social history.  She has a palpable mass that appears to be fluctuant nearly perfectly round measures 1.3 cm in diameter just inferior and anterior to the lateral malleolus.  It does not pulsate on palpation.  Assessment: Ganglion cyst right foot and ankle.  Plan: Consented her today for surgical excision ganglion cyst right foot and ankle.  We once again discussed the possible postop complications which may include but are not limited to postop pain bleeding swell infection recurrence need for further surgery overcorrection under correction also digit loss limb loss of life

## 2022-08-01 ENCOUNTER — Telehealth: Payer: Self-pay

## 2022-08-01 NOTE — Telephone Encounter (Signed)
Received surgery paperwork from the [place] office. Left a message for Alethia to call and schedule surgery with Dr. Milinda Pointer.

## 2022-08-06 ENCOUNTER — Telehealth: Payer: Self-pay | Admitting: Urology

## 2022-08-06 NOTE — Telephone Encounter (Signed)
DOS - 08/30/22  EXC GANGLION RIGHT --- [id number]  [employer]   SPOKE WITH ANA D. WITH [employer] AND SHE STATED THAT FOR CPT CODE 29562 NO PRIOR AUTH IS REQUIRED.  REF # [id number]

## 2022-08-28 ENCOUNTER — Other Ambulatory Visit: Payer: Self-pay | Admitting: Podiatry

## 2022-08-28 MED ORDER — ONDANSETRON HCL 4 MG PO TABS: 4.0000 mg | ORAL_TABLET | Freq: Three times a day (TID) | ORAL | 0 refills | Status: DC | PRN

## 2022-08-28 MED ORDER — TRAMADOL HCL 50 MG PO TABS: 50.0000 mg | ORAL_TABLET | Freq: Three times a day (TID) | ORAL | 0 refills | Status: AC | PRN

## 2022-08-28 MED ORDER — CEPHALEXIN 500 MG PO CAPS: 500.0000 mg | ORAL_CAPSULE | Freq: Three times a day (TID) | ORAL | 0 refills | Status: DC

## 2022-08-30 DIAGNOSIS — M67471 Ganglion, right ankle and foot: Secondary | ICD-10-CM

## 2022-09-04 ENCOUNTER — Ambulatory Visit (INDEPENDENT_AMBULATORY_CARE_PROVIDER_SITE_OTHER): Payer: 59 | Admitting: Podiatry

## 2022-09-04 DIAGNOSIS — M67471 Ganglion, right ankle and foot: Secondary | ICD-10-CM

## 2022-09-05 NOTE — Progress Notes (Signed)
  Subjective:  Patient ID: Zoe Maldonado, female    DOB: 16-Apr-1991,  MRN: [id number]  Chief Complaint  Patient presents with   Routine Post Op    POV #1 DOS 08/30/2022 EXCISION GANGLION CYST RT FOOT/ANKLE/DR HYAT PT     32 y.o. female returns for post-op check.  Doing well not having much pain  Review of Systems: Negative except as noted in the HPI. Denies N/V/F/Ch.   Objective:  There were no vitals filed for this visit. There is no height or weight on file to calculate BMI. Constitutional Well developed. Well nourished.  Vascular Foot warm and well perfused. Capillary refill normal to all digits.  Calf is soft and supple, no posterior calf or knee pain, negative Homans' sign  Neurologic Normal speech. Oriented to person, place, and time. Epicritic sensation to light touch grossly present bilaterally.  Dermatologic Skin healing well without signs of infection. Skin edges well coapted without signs of infection.  Orthopedic: Tenderness to palpation noted about the surgical site.    Assessment:   1. Ganglion cyst of right foot    Plan:  Patient was evaluated and treated and all questions answered.  S/p foot surgery right -Progressing as expected post-operatively. -Povidone ointment and sterile dressing applied. -May remove and bathe on Monday do not soak or scrub incision -Continue to WBAT in surgical shoe -Return in 2 weeks for follow-up for suture removal  Return in about 2 weeks (around 09/18/2022) for post op (no x-rays) .

## 2022-09-18 ENCOUNTER — Encounter: Payer: Self-pay | Admitting: Podiatry

## 2022-09-18 ENCOUNTER — Ambulatory Visit (INDEPENDENT_AMBULATORY_CARE_PROVIDER_SITE_OTHER): Payer: 59 | Admitting: Podiatry

## 2022-09-18 DIAGNOSIS — M67471 Ganglion, right ankle and foot: Secondary | ICD-10-CM

## 2022-09-18 DIAGNOSIS — Z9889 Other specified postprocedural states: Secondary | ICD-10-CM

## 2022-09-18 NOTE — Progress Notes (Signed)
She presents today for her second postop visit date of surgery 08/30/2022 excision ganglion cyst right foot.  She states is doing great and absolutely no problem whatsoever.  Objective: Vital signs stable alert oriented x 3 there is no erythema edema salines drainage or odor sutures are intact to remove sutures today margins remain well coapted placed dressing that she may remove in a day or so and then start washing the foot and soaking the foot.  She should be fine with no complications.  Follow-up with her on an as-needed basis.

## 2022-10-09 ENCOUNTER — Encounter: Payer: 59 | Admitting: Podiatry

## 2022-10-14 ENCOUNTER — Encounter: Payer: 59 | Admitting: Podiatry

## 2022-10-14 ENCOUNTER — Telehealth: Payer: Self-pay | Admitting: Podiatry

## 2022-10-14 NOTE — Telephone Encounter (Signed)
Pt left message today at 1258pm stating she was scheduled in [place] and appt was moved to [place] and pt is not able to make it to [place] office.  I called pt and left message for pt to call to r/s appt to [place] office.

## 2023-11-17 ENCOUNTER — Other Ambulatory Visit: Payer: Self-pay | Admitting: Otolaryngology

## 2023-11-17 DIAGNOSIS — K118 Other diseases of salivary glands: Secondary | ICD-10-CM

## 2023-11-17 DIAGNOSIS — R221 Localized swelling, mass and lump, neck: Secondary | ICD-10-CM

## 2023-11-24 ENCOUNTER — Ambulatory Visit
Admission: RE | Admit: 2023-11-24 | Discharge: 2023-11-24 | Disposition: A | Discharge status: Home / Self Care | Source: Ambulatory Visit | Attending: Otolaryngology | Admitting: Otolaryngology

## 2023-11-24 DIAGNOSIS — K118 Other diseases of salivary glands: Secondary | ICD-10-CM | POA: Diagnosis present

## 2023-11-24 DIAGNOSIS — R221 Localized swelling, mass and lump, neck: Secondary | ICD-10-CM | POA: Insufficient documentation

## 2023-11-25 LAB — OB RESULTS CONSOLE HEPATITIS B SURFACE ANTIGEN: Hepatitis B Surface Ag: NEGATIVE

## 2023-11-25 LAB — OB RESULTS CONSOLE RUBELLA ANTIBODY, IGM: Rubella: IMMUNE

## 2023-11-25 LAB — OB RESULTS CONSOLE GC/CHLAMYDIA
Chlamydia: NEGATIVE
Neisseria Gonorrhea: NEGATIVE

## 2023-11-25 LAB — OB RESULTS CONSOLE HIV ANTIBODY (ROUTINE TESTING): HIV: NONREACTIVE

## 2023-11-25 LAB — OB RESULTS CONSOLE VARICELLA ZOSTER ANTIBODY, IGG: Varicella: IMMUNE

## 2024-03-23 LAB — OB RESULTS CONSOLE RPR: RPR: NONREACTIVE

## 2024-05-16 ENCOUNTER — Observation Stay
Admission: EM | Admit: 2024-05-16 | Discharge: 2024-05-16 | Disposition: A | Discharge status: Home / Self Care | Attending: Certified Nurse Midwife | Admitting: Obstetrics and Gynecology

## 2024-05-16 ENCOUNTER — Other Ambulatory Visit: Payer: Self-pay

## 2024-05-16 ENCOUNTER — Observation Stay

## 2024-05-16 ENCOUNTER — Encounter: Payer: Self-pay | Admitting: Obstetrics and Gynecology

## 2024-05-16 DIAGNOSIS — O368190 Decreased fetal movements, unspecified trimester, not applicable or unspecified: Principal | ICD-10-CM | POA: Diagnosis present

## 2024-05-16 MED ORDER — CALCIUM CARBONATE ANTACID 500 MG PO CHEW: 2.0000 | CHEWABLE_TABLET | ORAL | Status: DC | PRN

## 2024-05-16 MED ORDER — ACETAMINOPHEN 325 MG PO TABS: 650.0000 mg | ORAL_TABLET | ORAL | Status: DC | PRN

## 2024-05-16 NOTE — OB Triage Note (Signed)
 Patient is a G2P1, 37 wks and 2 days presenting to [facility] triage for decreased fetal movement. Patient denies any abdominal pain, leaking of fluid, and vaginal bleeding. Patient placed on fetal monitors for NST. Will continue to observe.

## 2024-05-16 NOTE — OB Triage Note (Signed)
 Results from ultrasound relayed to Edsel Blush, CNM. Orders received to discharge patient to home. Patient reports positive fetal movement at this.  AVS given and patient ambulated off unit with stable gait with husband and son.

## 2024-05-17 NOTE — Discharge Summary (Addendum)
 Zoe Maldonado is a 33 y.o. female. She is at [redacted]w[redacted]d gestation. No LMP recorded. Patient is pregnant. Estimated Date of Delivery: 06/04/24  Prenatal care site: [facility]  Current pregnancy complicated by:  - anemia  Chief complaint: decreased fetal movement  She came in reporting decreased fetal movement this evening. She denies any contractions, bleeding, or leaking fluid. Once placed on the monitor she reported she was starting to feel the baby move like normal.  S: Resting comfortably. no CTX, no VB.no LOF,  Active fetal movement.  Denies: HA, visual changes, SOB, or RUQ/epigastric pain  Maternal Medical History:   Past Medical History:  Diagnosis Date   Asthma    as a child    Past Surgical History:  Procedure Laterality Date   Nexplanon  2016    Allergies  Allergen Reactions   Aspirin Swelling   Latex     Prior to Admission medications   Medication Sig Start Date End Date Taking? Authorizing Provider  cholecalciferol (VITAMIN D3) 10 MCG (400 UNIT) TABS tablet  02/22/22  Yes [provider]  Prenatal Vit-Fe Fumarate-FA (MULTIVITAMIN-PRENATAL) 27-0.8 MG TABS tablet Take 1 tablet by mouth daily at 12 noon.   Yes [provider]    Social History: She  reports that she has quit smoking. She has never used smokeless tobacco. She reports current alcohol use. She reports current drug use. Drugs: Marijuana and Other-see comments.  Family History: family history includes ADD / ADHD in her brother; Arthritis in her mother; Asthma in her brother; Diabetes in her maternal aunt.  no history of gyn cancers  Review of Systems: A full review of systems was performed and negative except as noted in the HPI.     O:  BP 118/67   Pulse 84   Temp 98 F (36.7 C)  No results found for this or any previous visit (from the past 48 hours).   Constitutional: NAD, AAOx3  HE/ENT: extraocular movements grossly intact, moist mucous membranes CV:  RRR PULM: nl respiratory effort, CTABL     Abd: gravid, non-tender, non-distended, soft      Ext: Non-tender, Nonedematous   Psych: mood appropriate, speech normal Pelvic: deferred  Fetal  monitoring: Cat 1 Appropriate for GA Baseline: 140bpm Variability: moderate Accelerations: present x >2 Decelerations absent Time 1 hour  EXAM: ULTRASOUND OB LIMITED   TECHNIQUE: Transabdominal/Transvaginal obstetric pelvic ultrasound was performed with color Doppler flow evaluation.   COMPARISON: None available.   CLINICAL HISTORY: Decreased fetal movement. Assigned gestational age [redacted] weeks and 2 days with estimated date 06/04/2024.   FINDINGS:   FETUS: A single live intrauterine pregnancy is present, estimated gestational age [redacted] weeks and 1 day. Movement yes.   POSITION: The fetus position is cephalic.   HEART RATE: Fetal heart rate measures 145 beats per minute.   PLACENTA: The placenta is located fundal. No previa.   AMNIOTIC FLUID: The amniotic fluid volume is subjectively normal. Amniotic fluid index measures 9.1 cm.   BIOMETRICS: Estimated fetal weight is  grams.   BPD measures 8.9 cm.  A/P: 33 y.o. [redacted]w[redacted]d here for antenatal surveillance for decreased fetal movement  Principle Diagnosis:  Normal fetal movement  Labor: not present.  Fetal Wellbeing: Reassuring Cat 1 tracing. AFI 9.1cm, modified BPP WNL. Reactive NST  D/c home stable, precautions reviewed, follow-up as scheduled.    Edsel Charlies Blush, CNM 05/17/2024 7:20 AM

## 2024-05-18 LAB — OB RESULTS CONSOLE GBS: GBS: NEGATIVE

## 2024-06-10 ENCOUNTER — Inpatient Hospital Stay: Admitting: General Practice

## 2024-06-10 ENCOUNTER — Other Ambulatory Visit: Payer: Self-pay

## 2024-06-10 ENCOUNTER — Inpatient Hospital Stay
Admission: EM | Admit: 2024-06-10 | Discharge: 2024-06-11 | DRG: 807 | Disposition: A | Discharge status: Home / Self Care | Attending: Obstetrics and Gynecology | Admitting: Obstetrics and Gynecology

## 2024-06-10 ENCOUNTER — Encounter: Payer: Self-pay | Admitting: Obstetrics and Gynecology

## 2024-06-10 DIAGNOSIS — K219 Gastro-esophageal reflux disease without esophagitis: Secondary | ICD-10-CM | POA: Diagnosis present

## 2024-06-10 DIAGNOSIS — O26893 Other specified pregnancy related conditions, third trimester: Secondary | ICD-10-CM | POA: Diagnosis present

## 2024-06-10 DIAGNOSIS — Z3A40 40 weeks gestation of pregnancy: Secondary | ICD-10-CM

## 2024-06-10 DIAGNOSIS — O9962 Diseases of the digestive system complicating childbirth: Secondary | ICD-10-CM | POA: Diagnosis present

## 2024-06-10 DIAGNOSIS — Z833 Family history of diabetes mellitus: Secondary | ICD-10-CM | POA: Diagnosis not present

## 2024-06-10 DIAGNOSIS — Z87891 Personal history of nicotine dependence: Secondary | ICD-10-CM

## 2024-06-10 DIAGNOSIS — O9902 Anemia complicating childbirth: Secondary | ICD-10-CM | POA: Diagnosis present

## 2024-06-10 DIAGNOSIS — O480 Post-term pregnancy: Principal | ICD-10-CM | POA: Diagnosis present

## 2024-06-10 DIAGNOSIS — Z3403 Encounter for supervision of normal first pregnancy, third trimester: Principal | ICD-10-CM

## 2024-06-10 LAB — TYPE AND SCREEN
ABO/RH(D): O POS
Antibody Screen: NEGATIVE

## 2024-06-10 LAB — CBC
HCT: 38.1 % (ref 36.0–46.0)
Hemoglobin: 12.9 g/dL (ref 12.0–15.0)
MCH: 30.9 pg (ref 26.0–34.0)
MCHC: 33.9 g/dL (ref 30.0–36.0)
MCV: 91.1 fL (ref 80.0–100.0)
Platelets: 301 K/uL (ref 150–400)
RBC: 4.18 MIL/uL (ref 3.87–5.11)
RDW: 14.5 % (ref 11.5–15.5)
WBC: 11.8 K/uL — ABNORMAL HIGH (ref 4.0–10.5)
nRBC: 0 % (ref 0.0–0.2)

## 2024-06-10 MED ORDER — OXYCODONE HCL 5 MG PO TABS: 5.0000 mg | ORAL_TABLET | ORAL | Status: DC | PRN

## 2024-06-10 MED ORDER — SIMETHICONE 80 MG PO CHEW: 80.0000 mg | CHEWABLE_TABLET | ORAL | Status: DC | PRN

## 2024-06-10 MED ORDER — MISOPROSTOL 200 MCG PO TABS
ORAL_TABLET | ORAL | Status: AC
  Filled 2024-06-10: qty 4

## 2024-06-10 MED ORDER — BENZOCAINE-MENTHOL 20-0.5 % EX AERO
1.0000 | INHALATION_SPRAY | CUTANEOUS | Status: DC | PRN
  Administered 2024-06-10: 1 via TOPICAL
  Filled 2024-06-10: qty 56

## 2024-06-10 MED ORDER — LIDOCAINE HCL (PF) 1 % IJ SOLN
INTRAMUSCULAR | Status: AC
  Filled 2024-06-10: qty 30

## 2024-06-10 MED ORDER — OXYTOCIN BOLUS FROM INFUSION
333.0000 mL | Freq: Once | INTRAVENOUS | Status: AC
  Administered 2024-06-10: 333 mL via INTRAVENOUS

## 2024-06-10 MED ORDER — DIPHENHYDRAMINE HCL 50 MG/ML IJ SOLN: 12.5000 mg | INTRAMUSCULAR | Status: DC | PRN

## 2024-06-10 MED ORDER — LACTATED RINGERS IV SOLN
500.0000 mL | Freq: Once | INTRAVENOUS | Status: AC
  Administered 2024-06-10: 500 mL via INTRAVENOUS

## 2024-06-10 MED ORDER — SENNOSIDES-DOCUSATE SODIUM 8.6-50 MG PO TABS
2.0000 | ORAL_TABLET | ORAL | Status: DC
  Administered 2024-06-10: 2 via ORAL
  Filled 2024-06-10: qty 2

## 2024-06-10 MED ORDER — MAGNESIUM HYDROXIDE 400 MG/5ML PO SUSP: 30.0000 mL | ORAL | Status: DC | PRN

## 2024-06-10 MED ORDER — WITCH HAZEL-GLYCERIN EX PADS
MEDICATED_PAD | CUTANEOUS | Status: AC
  Filled 2024-06-10: qty 100

## 2024-06-10 MED ORDER — ACETAMINOPHEN 325 MG PO TABS
650.0000 mg | ORAL_TABLET | ORAL | Status: DC | PRN
  Administered 2024-06-10: 650 mg via ORAL
  Filled 2024-06-10: qty 2

## 2024-06-10 MED ORDER — WITCH HAZEL-GLYCERIN EX PADS
1.0000 | MEDICATED_PAD | CUTANEOUS | Status: DC | PRN
  Administered 2024-06-10: 1 via TOPICAL
  Filled 2024-06-10: qty 100

## 2024-06-10 MED ORDER — AMMONIA AROMATIC IN INHA
RESPIRATORY_TRACT | Status: AC
  Filled 2024-06-10: qty 10

## 2024-06-10 MED ORDER — LACTATED RINGERS IV SOLN: INTRAVENOUS | Status: DC

## 2024-06-10 MED ORDER — PHENYLEPHRINE 80 MCG/ML (10ML) SYRINGE FOR IV PUSH (FOR BLOOD PRESSURE SUPPORT): 80.0000 ug | PREFILLED_SYRINGE | INTRAVENOUS | Status: DC | PRN

## 2024-06-10 MED ORDER — DIPHENHYDRAMINE HCL 25 MG PO CAPS: 25.0000 mg | ORAL_CAPSULE | Freq: Four times a day (QID) | ORAL | Status: DC | PRN

## 2024-06-10 MED ORDER — OXYTOCIN 10 UNIT/ML IJ SOLN
INTRAMUSCULAR | Status: AC
  Filled 2024-06-10: qty 2

## 2024-06-10 MED ORDER — OXYCODONE-ACETAMINOPHEN 5-325 MG PO TABS: 2.0000 | ORAL_TABLET | ORAL | Status: DC | PRN

## 2024-06-10 MED ORDER — ONDANSETRON HCL 4 MG/2ML IJ SOLN: 4.0000 mg | INTRAMUSCULAR | Status: DC | PRN

## 2024-06-10 MED ORDER — LIDOCAINE-EPINEPHRINE (PF) 1.5 %-1:200000 IJ SOLN
INTRAMUSCULAR | Status: AC | PRN
  Administered 2024-06-10: 3 mL via PERINEURAL

## 2024-06-10 MED ORDER — DIBUCAINE (PERIANAL) 1 % EX OINT
1.0000 | TOPICAL_OINTMENT | CUTANEOUS | Status: DC | PRN
  Administered 2024-06-10: 1 via RECTAL
  Filled 2024-06-10: qty 28

## 2024-06-10 MED ORDER — ZOLPIDEM TARTRATE 5 MG PO TABS: 5.0000 mg | ORAL_TABLET | Freq: Every evening | ORAL | Status: DC | PRN

## 2024-06-10 MED ORDER — LIDOCAINE HCL (PF) 1 % IJ SOLN
30.0000 mL | INTRAMUSCULAR | Status: AC | PRN
  Administered 2024-06-10: 30 mL via SUBCUTANEOUS

## 2024-06-10 MED ORDER — FENTANYL CITRATE (PF) 100 MCG/2ML IJ SOLN: 50.0000 ug | INTRAMUSCULAR | Status: DC | PRN

## 2024-06-10 MED ORDER — COCONUT OIL OIL: 1.0000 | TOPICAL_OIL | Status: DC | PRN

## 2024-06-10 MED ORDER — EPHEDRINE 5 MG/ML INJ: 10.0000 mg | INTRAVENOUS | Status: DC | PRN

## 2024-06-10 MED ORDER — ONDANSETRON HCL 4 MG PO TABS: 4.0000 mg | ORAL_TABLET | ORAL | Status: DC | PRN

## 2024-06-10 MED ORDER — ONDANSETRON HCL 4 MG/2ML IJ SOLN: 4.0000 mg | Freq: Four times a day (QID) | INTRAMUSCULAR | Status: DC | PRN

## 2024-06-10 MED ORDER — FENTANYL-BUPIVACAINE-NACL 0.5-0.125-0.9 MG/250ML-% EP SOLN
12.0000 mL/h | EPIDURAL | Status: DC | PRN
  Administered 2024-06-10: 12 mL/h via EPIDURAL
  Filled 2024-06-10: qty 250

## 2024-06-10 MED ORDER — IBUPROFEN 600 MG PO TABS
600.0000 mg | ORAL_TABLET | Freq: Four times a day (QID) | ORAL | Status: DC
  Administered 2024-06-10 – 2024-06-11 (×4): 600 mg via ORAL
  Filled 2024-06-10 (×4): qty 1

## 2024-06-10 MED ORDER — OXYCODONE-ACETAMINOPHEN 5-325 MG PO TABS: 1.0000 | ORAL_TABLET | ORAL | Status: DC | PRN

## 2024-06-10 MED ORDER — DIBUCAINE (PERIANAL) 1 % EX OINT
TOPICAL_OINTMENT | CUTANEOUS | Status: AC
  Filled 2024-06-10: qty 28

## 2024-06-10 MED ORDER — SOD CITRATE-CITRIC ACID 500-334 MG/5ML PO SOLN: 30.0000 mL | ORAL | Status: DC | PRN

## 2024-06-10 MED ORDER — PRENATAL MULTIVITAMIN CH
1.0000 | ORAL_TABLET | Freq: Every day | ORAL | Status: DC
  Administered 2024-06-10: 1 via ORAL
  Filled 2024-06-10 (×2): qty 1

## 2024-06-10 MED ORDER — OXYTOCIN-SODIUM CHLORIDE 30-0.9 UT/500ML-% IV SOLN
2.5000 [IU]/h | INTRAVENOUS | Status: DC
  Administered 2024-06-10: 2.5 [IU]/h via INTRAVENOUS
  Filled 2024-06-10: qty 500

## 2024-06-10 MED ORDER — BENZOCAINE-MENTHOL 20-0.5 % EX AERO
INHALATION_SPRAY | CUTANEOUS | Status: AC
  Filled 2024-06-10: qty 56

## 2024-06-10 MED ORDER — LACTATED RINGERS IV SOLN: 500.0000 mL | INTRAVENOUS | Status: DC | PRN

## 2024-06-10 MED ORDER — SODIUM CHLORIDE 0.9 % IV SOLN
INTRAVENOUS | Status: AC | PRN
  Administered 2024-06-10 (×2): 4 mL via EPIDURAL

## 2024-06-10 MED ORDER — FERROUS SULFATE 325 (65 FE) MG PO TABS
325.0000 mg | ORAL_TABLET | Freq: Two times a day (BID) | ORAL | Status: DC
  Administered 2024-06-10 – 2024-06-11 (×2): 325 mg via ORAL
  Filled 2024-06-10 (×2): qty 1

## 2024-06-10 MED ORDER — ACETAMINOPHEN 325 MG PO TABS: 650.0000 mg | ORAL_TABLET | ORAL | Status: DC | PRN

## 2024-06-10 NOTE — Anesthesia Preprocedure Evaluation (Signed)
"                                    Anesthesia Evaluation  Patient identified by MRN, date of birth, ID band Patient awake    Reviewed: Allergy & Precautions, H&P , NPO status , Patient's Chart, lab work & pertinent test results, reviewed documented beta blocker date and time   History of Anesthesia Complications Negative for: history of anesthetic complications  Airway Mallampati: II  TM Distance: >3 FB Neck ROM: full    Dental no notable dental hx.    Pulmonary neg shortness of breath, asthma (as a child) , neg recent URI, former smoker   Pulmonary exam normal breath sounds clear to auscultation       Cardiovascular Exercise Tolerance: Good negative cardio ROS Normal cardiovascular exam Rhythm:regular Rate:Normal     Neuro/Psych negative neurological ROS  negative psych ROS   GI/Hepatic Neg liver ROS,GERD  ,,  Endo/Other  negative endocrine ROS    Renal/GU      Musculoskeletal   Abdominal   Peds  Hematology negative hematology ROS (+)   Anesthesia Other Findings Past Medical History: No date: Asthma     Comment:  as a child   Reproductive/Obstetrics (+) Pregnancy                              Anesthesia Physical Anesthesia Plan  ASA: 2  Anesthesia Plan: Epidural   Post-op Pain Management:    Induction:   PONV Risk Score and Plan: 2  Airway Management Planned: Natural Airway  Additional Equipment:   Intra-op Plan:   Post-operative Plan:   Informed Consent: I have reviewed the patients History and Physical, chart, labs and discussed the procedure including the risks, benefits and alternatives for the proposed anesthesia with the patient or authorized representative who has indicated his/her understanding and acceptance.     Dental Advisory Given  Plan Discussed with:   Anesthesia Plan Comments: (Patient reports no bleeding problems and no anticoagulant use.   Patient consented for risks of  anesthesia including but not limited to:  - adverse reactions to medications - risk of bleeding, infection and or nerve damage from epidural that could lead to paralysis - risk of headache or failed epidural - nerve damage due to positioning - that if epidural is used for C-section that there is a chance of epidural failure requiring spinal placement or conversion to GA - Damage to heart, brain, lungs, other parts of body or loss of life  Patient voiced understanding and assent.)        Anesthesia Quick Evaluation  "

## 2024-06-10 NOTE — L&D Delivery Note (Signed)
 Delivery Note At 9:23 AM a viable female was delivered via Vaginal, Spontaneous (Presentation:VTX/ Left Occiput Anterior).  APGAR: 8, 9; weight 8 lb 9.2 oz (3890 g).   Placenta status: Spontaneous, Intact.  Cord: 3 vessels with the following complications: None.  Cord pH: n/a  Mother pushed for 60 min and head was delivered with perineal support . Loose nuchal cord reduced . Meconium staining noted prior to delivery . Shoulders delivered without difficulty . SABRA Vigorous female placed on Mother's abdomen . Apgars 8/9. Nursery staff in attendance . Placenta delivered with meconium staining - intact. Iv pitocin  administered . Good hemostasis . Second degree laceration repair 00/ 000 vicryl. Uncomplicated repair   Anesthesia: Epidural Episiotomy: None Lacerations: 2nd degree;Perineal Suture Repair: 2.0 3.0 vicryl Est. Blood Loss (mL): 350cc  Mom to postpartum.  Baby to Couplet care / Skin to Skin.  Debby PARAS Wendal Wilkie 06/10/2024, 10:21 AM

## 2024-06-10 NOTE — Anesthesia Procedure Notes (Signed)
 Epidural Patient location during procedure: OB Start time: 06/10/2024 7:32 AM End time: 06/10/2024 7:36 AM  Staffing Anesthesiologist: Leavy Ned, MD Performed: anesthesiologist   Preanesthetic Checklist Completed: patient identified, IV checked, site marked, risks and benefits discussed, surgical consent, monitors and equipment checked, pre-op evaluation and timeout performed  Epidural Patient position: sitting Prep: Betadine Patient monitoring: heart rate, continuous pulse ox and blood pressure Approach: midline Location: L4-L5 Injection technique: LOR saline  Needle:  Needle type: Tuohy  Needle gauge: 18 G Needle length: 9 cm and 9 Needle insertion depth: 6 cm Catheter type: closed end flexible Catheter size: 20 Guage Catheter at skin depth: 11 cm Test dose: negative and 1.5% lidocaine  with Epi 1:200 K  Assessment Sensory level: T4 Events: blood not aspirated, no cerebrospinal fluid, injection not painful, no injection resistance, no paresthesia and negative IV test  Additional Notes 1 attempt Pt. Evaluated and documentation done after procedure finished. Patient identified. Risks/Benefits/Options discussed with patient including but not limited to bleeding, infection, nerve damage, paralysis, failed block, incomplete pain control, headache, blood pressure changes, nausea, vomiting, reactions to medication both or allergic, itching and postpartum back pain. Confirmed with bedside nurse the patient's most recent platelet count. Confirmed with patient that they are not currently taking any anticoagulation, have any bleeding history or any family history of bleeding disorders. Patient expressed understanding and wished to proceed. All questions were answered. Sterile technique was used throughout the entire procedure. Please see nursing notes for vital signs. Test dose was given through epidural catheter and negative prior to continuing to dose epidural or start infusion.  Warning signs of high block given to the patient including shortness of breath, tingling/numbness in hands, complete motor block, or any concerning symptoms with instructions to call for help. Patient was given instructions on fall risk and not to get out of bed. All questions and concerns addressed with instructions to call with any issues or inadequate analgesia.    Patient tolerated the insertion well without immediate complications. Reason for block:procedure for pain

## 2024-06-10 NOTE — H&P (Signed)
 OB History & Physical   History of Present Illness:  Chief Complaint:   HPI:  Zoe Maldonado is a 34 y.o. G2P1001 female at [redacted]w[redacted]d dated by LMP.  She presents to L&D for active labor.  She reports:  -active fetal movement -no leakage of fluid -bloody show noted this morning -onset of contractions at 0300 currently - irregular  Pregnancy Issues: 1. Anemia   Maternal Medical History:   Past Medical History:  Diagnosis Date   Asthma    as a child    Past Surgical History:  Procedure Laterality Date   Nexplanon  2016    Allergies[1]  Prior to Admission medications  Medication Sig Start Date End Date Taking? Authorizing Provider  cholecalciferol (VITAMIN D3) 10 MCG (400 UNIT) TABS tablet  02/22/22  Yes [provider]  ferrous sulfate 325 (65 FE) MG tablet Take 325 mg by mouth daily with breakfast.   Yes [provider]  magnesium (MAGTAB) 84 MG ( ) TBCR SR tablet Take 84 mg by mouth daily.   Yes [provider]  Prenatal Vit-Fe Fumarate-FA (MULTIVITAMIN-PRENATAL) 27-0.8 MG TABS tablet Take 1 tablet by mouth daily at 12 noon.   Yes [provider]     Prenatal care site: [facility]   Social History: She  reports that she has quit smoking. She has never used smokeless tobacco. She reports current alcohol use. She reports current drug use. Drugs: Marijuana and Other-see comments.  Family History: family history includes ADD / ADHD in her brother; Arthritis in her mother; Asthma in her brother; Diabetes in her maternal aunt.   Review of Systems: A full review of systems was performed and negative except as noted in the HPI.    Physical Exam:  Vital Signs: BP (!) 128/91 (BP Location: Left Arm)   Pulse 81   Temp 97.9 F (36.6 C) (Oral)   Resp 16   Ht 5' 3 (1.6 m)   Wt 97.1 kg   BMI 37.91 kg/m   General:   alert and cooperative  Skin:  normal  Neurologic:    Alert & oriented x 3  Lungs:   Nl effort  Heart:    regular rate and rhythm  Abdomen:  normal findings: soft, non-tender  Extremities: : non-tender, symmetric, no edema bilaterally.      Pertinent Results:  Prenatal Labs: Blood type/Rh O pos  Antibody screen neg  Rubella Immune  Varicella Immune  RPR NR  HBsAg Neg  HIV NR  GC neg  Chlamydia neg  Genetic screening negative  1 hour GTT 152  3 hour GTT F67, 138, 118, 130   GBS Neg   FHT: FHR: 145 bpm, variability: moderate,  accelerations:  Present,  decelerations:  Absent Category/reactivity:  Category I TOCO: irregular SVE: Dilation: 7 / Effacement (%): 90 / Station: -1    Cephalic by leopolds  US  OB Limited Result Date: 05/16/2024 EXAM: ULTRASOUND OB LIMITED TECHNIQUE: Transabdominal/Transvaginal obstetric pelvic ultrasound was performed with color Doppler flow evaluation. COMPARISON: None available. CLINICAL HISTORY: Decreased fetal movement. Assigned gestational age [redacted] weeks and 2 days with estimated date 06/04/2024. FINDINGS: FETUS: A single live intrauterine pregnancy is present, estimated gestational age [redacted] weeks and 1 day. Movement yes. POSITION: The fetus position is cephalic. HEART RATE: Fetal heart rate measures 145 beats per minute. PLACENTA: The placenta is located fundal. No previa. AMNIOTIC FLUID: The amniotic fluid volume is subjectively normal. Amniotic fluid index measures 9.1 cm. BIOMETRICS: Estimated fetal weight is  grams. BPD measures 8.9 cm. IMPRESSION: 1. Single live intrauterine pregnancy with gestational age of [redacted] weeks and 1 day by current sonographic biometry concordant with assigned gestational age of [redacted] weeks and 2 days.\ Electronically signed by: Morgane Naveau MD 05/16/2024 09:20 PM EST RP Workstation: [id number]     Assessment:  Zoe Maldonado is a 34 y.o. G2P1001 female at [redacted]w[redacted]d with active labor.   Plan:  1. Admit to Labor & Delivery; consents reviewed and obtained  2. Fetal Well being  - Fetal Tracing: Cat I - GBS neg - Presentation: vtx  confirmed by sve   3. Routine OB: - Prenatal labs reviewed, as above - Rh pos - CBC & T&S on admit - Clear fluids, IVF  4. Monitoring of Labor -  Contractions by external toco in place -  Pelvis proven to 3610g -  Plan for continuous fetal monitoring  -  Maternal pain control as desired: IVPM, nitrous, regional anesthesia - Anticipate vaginal delivery  5. Post Partum Planning: - Infant feeding: TBD - Contraception: TBD - Tdap: Declined prenatally  - Flu: Declined prenatally  - RSV: Declined prenatally   Ashantee Deupree, CNM 06/10/2024 6:48 AM       [1]  Allergies Allergen Reactions   Aspirin Swelling   Latex

## 2024-06-10 NOTE — Discharge Summary (Signed)
 Obstetrical Discharge Summary  Patient Name: Zoe Maldonado DOB: 07/23/1990 MRN: [id number]  Date of Admission: 06/10/2024 Date of Delivery: 06/10/24(manually enter date) Delivered by: ONEIDA Dinsmore MD Date of Discharge: 06/11/2024   Primary OB: [facility]   LMP:No LMP recorded. 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   Perianal abscess     Augmentation: AROM Complications: None Intrapartum complications/course:  Date of Delivery:  Delivered By: IVAR Dinsmore MD Delivery Type: spontaneous vaginal delivery Anesthesia: epidural Placenta: spontaneous Laceration: second degree Episiotomy: none Newborn Data: Live born female  Birth Weight: 8 lb 9.2 oz (3890 g) APGAR: 8, 9  Newborn Delivery   Birth date/time: 06/10/2024 09:23:00 Delivery type: Vaginal, Spontaneous       Postpartum Procedures:   Edinburgh:     06/10/2024    3:50 PM 06/14/2021    3:47 PM 04/28/2021    5:40 PM 04/28/2021   12:30 PM  Edinburgh Postnatal Depression Scale Screening Tool  I have been able to laugh and see the funny side of things. 0 0  0  --   I have looked forward with enjoyment to things. 0 0  0    I have blamed myself unnecessarily when things went wrong. 0 0  1    I have been anxious or worried for no good reason. 2 2  1     I have felt scared or panicky for no good reason. 1 1  0    Things have been getting on top of me. 1 1  0    I have been so unhappy that I have had difficulty sleeping. 0 0  0    I have felt sad or miserable. 0 1  1    I have been so unhappy that I have been crying. 0 1  0    The thought of harming myself has occurred to me. 0 0  0    Edinburgh  Postnatal Depression Scale Total 4 6  3        Data saved with a previous flowsheet row definition    Post partum course:  Patient had an uncomplicated postpartum course.  By time of discharge on PPD#1, her pain was controlled on oral pain medications; she had appropriate lochia and was ambulating, voiding without difficulty and tolerating regular diet.  She was deemed stable for discharge to home.   Discharge Physical Exam:  BP 114/83 (BP Location: Left Arm)   Pulse 92   Temp 97.6 F (36.4 C) (Oral)   Resp 18   Ht 5' 3 (1.6 m)   Wt 97.1 kg   SpO2 97%   Breastfeeding Unknown   BMI 37.91 kg/m   General: NAD CV: RRR Pulm: CTABL, nl effort ABD: s/nd/nt, fundus firm and below the umbilicus Lochia: moderate Incision: c/d/i DVT Evaluation: LE non-ttp, no evidence of DVT on exam.  Hemoglobin  Date Value Ref Range Status  06/11/2024 10.7 (L) 12.0 - 15.0 g/dL Final  90/97/7977 88.3 11.1 - 15.9 g/dL Final   HCT  Date Value Ref Range Status  06/11/2024 32.5 (L) 36.0 - 46.0 % Final   Hematocrit  Date Value Ref Range Status  02/09/2021 36.0 34.0 - 46.6 % Final   Results  for orders placed or performed during the hospital encounter of 06/10/24 (from the past 24 hours)  CBC     Status: Abnormal   Collection Time: 06/11/24  3:37 AM  Result Value Ref Range   WBC 14.6 (H) 4.0 - 10.5 K/uL   RBC 3.46 (L) 3.87 - 5.11 MIL/uL   Hemoglobin 10.7 (L) 12.0 - 15.0 g/dL   HCT 67.4 (L) 63.9 - 53.9 %   MCV 93.9 80.0 - 100.0 fL   MCH 30.9 26.0 - 34.0 pg   MCHC 32.9 30.0 - 36.0 g/dL   RDW 85.3 88.4 - 84.4 %   Platelets 239 150 - 400 K/uL   nRBC 0.1 0.0 - 0.2 %     Disposition: stable, discharge to home. Baby Feeding: breastmilkformula Baby Disposition: home with mom  Rh Immune globulin given:  Rubella vaccine given:  Tdap vaccine given in AP or PP setting:  Flu vaccine given in AP or PP setting:   Contraception: undecided  Prenatal Labs:   Blood type/Rh O pos  Antibody screen  neg  Rubella Immune  Varicella Immune  RPR NR  HBsAg Neg  HIV NR  GC neg  Chlamydia neg  Genetic screening negative  1 hour GTT 152  3 hour GTT F67, 138, 118, 130   GBS Neg    Plan:  Zoe Maldonado was discharged to home in good condition. Follow-up appointment with delivering provider in 6 weeks.  Discharge Medications: Tylenol  1000 mg q 6 hrs prn      Signed: Luv Mish MD

## 2024-06-10 NOTE — OB Triage Note (Signed)
 Pt is a G2P1 at [redacted]w[redacted]d gestation reporting to L/D triage due to complaints of vaginal bleeding.  She reports going to the bathroom about an hour ago and noticing blood on the toilet paper when wiping.  She reports intermittent pain in her perineum. She states that she is unable to determine whether the pain she is experiencing is ctx/cramping. She rates the pain 7/10.  Denies LOF. Endorses +FM.  Monitors applied and assessing with initial FHT 145 at 0509.  VSS.  Zoe Maldonado CNM aware of pt arrival to the unit. Requesting a cervical exam and extended monitoring. Cervical exam at 0530 reveals 4-5cm.  Zoe Maldonado CNM planning to recheck around 0700.

## 2024-06-10 NOTE — Lactation Note (Signed)
 This note was copied from a baby's chart. Lactation Consultation Note  Patient Name: Zoe Maldonado Today's Date: 06/10/2024 Age:32 hours Reason for consult: L&D Initial assessment;1st time breastfeeding;Term   Maternal Data Does the patient have breastfeeding experience prior to this delivery?: Yes How long did the patient breastfeed?: 2 yrs  Feeding Mother's Current Feeding Choice: Breast Milk BAby nursing at left breast in cradle hold when room entered, occ audible swallow noted, lips flanged at breast, will assess for tongue tie later, mom experienced breastfeeding LATCH Score Latch: Grasps breast easily, tongue down, lips flanged, rhythmical sucking.  Audible Swallowing: A few with stimulation  Type of Nipple: Everted at rest and after stimulation  Comfort (Breast/Nipple): Soft / non-tender  Hold (Positioning): No assistance needed to correctly position infant at breast.  LATCH Score: 9   Lactation Tools Discussed/Used    Interventions Interventions: Breast feeding basics reviewed;Skin to skin;Support pillows;Education  Discharge Pump: Personal WIC Program: No  Consult Status Consult Status: Follow-up from L&D Date: 06/10/24 Follow-up type: In-patient    Aldona JONETTA Converse 06/10/2024, 10:25 AM

## 2024-06-11 LAB — CBC
HCT: 32.5 % — ABNORMAL LOW (ref 36.0–46.0)
Hemoglobin: 10.7 g/dL — ABNORMAL LOW (ref 12.0–15.0)
MCH: 30.9 pg (ref 26.0–34.0)
MCHC: 32.9 g/dL (ref 30.0–36.0)
MCV: 93.9 fL (ref 80.0–100.0)
Platelets: 239 K/uL (ref 150–400)
RBC: 3.46 MIL/uL — ABNORMAL LOW (ref 3.87–5.11)
RDW: 14.6 % (ref 11.5–15.5)
WBC: 14.6 K/uL — ABNORMAL HIGH (ref 4.0–10.5)
nRBC: 0.1 % (ref 0.0–0.2)

## 2024-06-11 LAB — SYPHILIS: RPR W/REFLEX TO RPR TITER AND TREPONEMAL ANTIBODIES, TRADITIONAL SCREENING AND DIAGNOSIS ALGORITHM: RPR Ser Ql: NONREACTIVE

## 2024-06-11 NOTE — Lactation Note (Signed)
 This note was copied from a baby's chart. Lactation Consultation Note  Patient Name: Zoe Maldonado Today's Date: 06/11/2024 Age:32 hours Reason for consult: Follow-up assessment   Maternal Data Lactation was requested to observe the baby's latch and feeding. Infant was content in mother's arms when LC entered. Mother reported that baby had just finished feeding for approximately 15 minutes on the right side. Mother reported that the feeding was comfortable except for some pain with the initial latch. She described general chapped-feeling soreness, but no nipple damage was observed. While LC was providing education on nipple care, the baby started to fuss and show hunger cues. Baby latched easily on the left side. Lower lip was slightly tucked. Baby self-corrected as the LC adjusted the breast tissue. Mother reported that the feeding felt comfortable. Baby reattached with lower lip tucked again, but it was an easy adjustment, and mom reported that the feeding felt comfortable. She said she's been alternating breasts at feeds, depending on baby's cues.   Mother had questions about flange sizing for pumping. LC used measuring tool: Patient measured 18 mm on the Right side and 16 mm on the Left side. LC discussed measurement as a starting point and best practices for flange sizing while pumping: only the nipple being pulled into the tunnel, moving freely (if it's rubbing, flange is too small), but no areola being pulled into the tunnel (too large). LC mentioned outpatient lactation for follow-up questions or concerns.     Feeding Mother's Current Feeding Choice: Breast Milk  LATCH Score Latch: Grasps breast easily, tongue down, lips flanged, rhythmical sucking.  Audible Swallowing: Spontaneous and intermittent  Type of Nipple: Everted at rest and after stimulation  Comfort (Breast/Nipple): Filling, red/small blisters or bruises, mild/mod discomfort  Hold (Positioning): No assistance  needed to correctly position infant at breast.  LATCH Score: 9   Lactation Tools Discussed/Used  For nipple care, LC brought breast shells, demonstrated how to assemble and use them, and discussed nipple care with her nipple cream/butter from home. LC mentioned avoiding tight fitting bras while using the shells, to avoid placing pressure on the milk ducts, and to not sleep in the breast shells overnight.   Mother reported that she has two DEBP at home: Spectra  S1 and MomCozy wearable. We discussed ordering and using flange inserts for sizing.  Interventions Interventions: Education;Assisted with latch;Breast massage;Shells  Discharge Discharge Education: Outpatient recommendation Pump: DEBP;Hands Free  Consult Status Consult Status: Complete Follow-up type: Call as needed   Zoe Maldonado 06/11/2024, 11:04 AM

## 2024-06-11 NOTE — Progress Notes (Signed)
 Patient discharged. Discharge instructions given. Patient verbalizes understanding. Transported by axillary.

## 2024-06-11 NOTE — Lactation Note (Signed)
 This note was copied from a baby's chart. Lactation Consultation Note  Patient Name: Zoe Maldonado Today's Date: 06/11/2024 Age:32 hours Reason for consult: Follow-up assessment;Term;RN request   Maternal Data Lactation requested to check in on MOB and infant.  Infant seems to be very fussy.  Mom verbalized upon entry, feedings are going good but her nipples are sore.  She has not seen any damage to her nipples though. At 23hrs of age infant has had 2 wet/2 dirty diapers.    Feeding Mother's Current Feeding Choice: Breast Milk  Interventions Interventions: Education  LC reviewed the clusterfeeding behavior and infant is probably exhibiting these characteristics now.  Lactation will return for the next feeding to observe a feeding at the breast.    **Bring Sore Nipple Shells for patient.**  Discharge Discharge Education: Outpatient recommendation Pump: Personal;Hands Sylar Voong;DEBP  Consult Status Consult Status: Follow-up Follow-up type: In-patient    Dawon Troop S Augustine Brannick 06/11/2024, 8:54 AM

## 2024-06-15 ENCOUNTER — Emergency Department

## 2024-06-15 ENCOUNTER — Other Ambulatory Visit: Payer: Self-pay

## 2024-06-15 ENCOUNTER — Emergency Department
Admission: EM | Admit: 2024-06-15 | Discharge: 2024-06-16 | Disposition: A | Discharge status: Home / Self Care | Attending: Emergency Medicine | Admitting: Emergency Medicine

## 2024-06-15 DIAGNOSIS — O9953 Diseases of the respiratory system complicating the puerperium: Secondary | ICD-10-CM | POA: Insufficient documentation

## 2024-06-15 DIAGNOSIS — N719 Inflammatory disease of uterus, unspecified: Secondary | ICD-10-CM

## 2024-06-15 DIAGNOSIS — J45909 Unspecified asthma, uncomplicated: Secondary | ICD-10-CM | POA: Diagnosis not present

## 2024-06-15 DIAGNOSIS — O8612 Endometritis following delivery: Secondary | ICD-10-CM | POA: Insufficient documentation

## 2024-06-15 DIAGNOSIS — O9089 Other complications of the puerperium, not elsewhere classified: Secondary | ICD-10-CM | POA: Diagnosis present

## 2024-06-15 DIAGNOSIS — B9689 Other specified bacterial agents as the cause of diseases classified elsewhere: Secondary | ICD-10-CM | POA: Diagnosis not present

## 2024-06-15 LAB — CBC WITH DIFFERENTIAL/PLATELET
Abs Immature Granulocytes: 0.07 K/uL (ref 0.00–0.07)
Basophils Absolute: 0 K/uL (ref 0.0–0.1)
Basophils Relative: 0 %
Eosinophils Absolute: 0.2 K/uL (ref 0.0–0.5)
Eosinophils Relative: 2 %
HCT: 34 % — ABNORMAL LOW (ref 36.0–46.0)
Hemoglobin: 10.9 g/dL — ABNORMAL LOW (ref 12.0–15.0)
Immature Granulocytes: 1 %
Lymphocytes Relative: 15 %
Lymphs Abs: 1.6 K/uL (ref 0.7–4.0)
MCH: 30.2 pg (ref 26.0–34.0)
MCHC: 32.1 g/dL (ref 30.0–36.0)
MCV: 94.2 fL (ref 80.0–100.0)
Monocytes Absolute: 0.8 K/uL (ref 0.1–1.0)
Monocytes Relative: 8 %
Neutro Abs: 8.1 K/uL — ABNORMAL HIGH (ref 1.7–7.7)
Neutrophils Relative %: 74 %
Platelets: 346 K/uL (ref 150–400)
RBC: 3.61 MIL/uL — ABNORMAL LOW (ref 3.87–5.11)
RDW: 14.6 % (ref 11.5–15.5)
WBC: 10.8 K/uL — ABNORMAL HIGH (ref 4.0–10.5)
nRBC: 0.2 % (ref 0.0–0.2)

## 2024-06-15 LAB — COMPREHENSIVE METABOLIC PANEL WITH GFR
ALT: 34 U/L (ref 0–44)
AST: 29 U/L (ref 15–41)
Albumin: 3.6 g/dL (ref 3.5–5.0)
Alkaline Phosphatase: 81 U/L (ref 38–126)
Anion gap: 13 (ref 5–15)
BUN: 14 mg/dL (ref 6–20)
CO2: 23 mmol/L (ref 22–32)
Calcium: 9 mg/dL (ref 8.9–10.3)
Chloride: 103 mmol/L (ref 98–111)
Creatinine, Ser: 0.93 mg/dL (ref 0.44–1.00)
GFR, Estimated: >60 mL/min
Glucose, Bld: 113 mg/dL — ABNORMAL HIGH (ref 70–99)
Potassium: 4.3 mmol/L (ref 3.5–5.1)
Sodium: 139 mmol/L (ref 135–145)
Total Bilirubin: <0.2 mg/dL (ref 0.0–1.2)
Total Protein: 6.7 g/dL (ref 6.5–8.1)

## 2024-06-15 LAB — URINALYSIS, W/ REFLEX TO CULTURE (INFECTION SUSPECTED)
Bilirubin Urine: NEGATIVE
Glucose, UA: NEGATIVE mg/dL
Ketones, ur: NEGATIVE mg/dL
Nitrite: NEGATIVE
Protein, ur: 100 mg/dL — AB
RBC / HPF: >50 RBC/hpf (ref 0–5)
Specific Gravity, Urine: 1.005 (ref 1.005–1.030)
WBC, UA: >50 WBC/hpf (ref 0–5)
pH: 6 (ref 5.0–8.0)

## 2024-06-15 LAB — PROTIME-INR
INR: 1 (ref 0.8–1.2)
Prothrombin Time: 14.2 s (ref 11.4–15.2)

## 2024-06-15 LAB — LACTIC ACID, PLASMA: Lactic Acid, Venous: 1.9 mmol/L (ref 0.5–1.9)

## 2024-06-15 NOTE — ED Notes (Signed)
 Discussed pt presentation with EDP Bradler. Verbal order received.

## 2024-06-15 NOTE — ED Triage Notes (Signed)
 G2P2 - spontaneous vaginal delivery 1/1 with second degree laceration. Pt recommend ED visit by On Call  OBGYN at [facility]. Pt reports concern abdominal cramps, pelvic pain, chills, and ongoing foul smelling red/brown bleeding that started today. Changing pads 2-3 time a day. No cough.

## 2024-06-15 NOTE — ED Notes (Addendum)
 Pt reports vag delivery 1/1, st that she spoke with Delon Coe regarding her c/o chills, pelvic cramping and lower abd pain and was informed to come get evaluated for possible uterine infection; this nurse called & spoke with L&D nurse Asberry Helling RN who in turn spoke with the provider and infomed me that pt needs to be evaluated in ED

## 2024-06-15 NOTE — ED Provider Notes (Signed)
 "  [facility] Provider Note    Event Date/Time   First MD Initiated Contact with Patient 06/15/24 2326     (approximate)   History   Chief Complaint Postpartum Complications   HPI  Zoe Maldonado is a 34 y.o. female with past medical history of asthma who presents to the ED complaining of postpartum complications.  Patient reports that she had an uncomplicated vaginal delivery on 1/1, did have a grade 2 vaginal laceration at the time.  Over the past 24 hours, she has had increasing crampy pain across her lower abdomen as well as increase in bleeding.  She has had to change her pad 3 times today and reports dark brown discharge on the last pad.  She has had some chills as well as borderline fever at home with temp of 99.8.  She spoke to the on-call midwife for [facility] clinic, who recommended she come to the ED for evaluation.     Physical Exam   Triage Vital Signs: ED Triage Vitals [06/15/24 2014]  Encounter Vitals Group     BP 128/88     Girls Systolic BP Percentile      Girls Diastolic BP Percentile      Boys Systolic BP Percentile      Boys Diastolic BP Percentile      Pulse Rate 94     Resp 19     Temp 98 F (36.7 C)     Temp Source Oral     SpO2 99 %     Weight      Height      Head Circumference      Peak Flow      Pain Score 8     Pain Loc      Pain Education      Exclude from Growth Chart     Most recent vital signs: Vitals:   06/15/24 2014  BP: 128/88  Pulse: 94  Resp: 19  Temp: 98 F (36.7 C)  SpO2: 99%    Constitutional: Alert and oriented. Eyes: Conjunctivae are normal. Head: Atraumatic. Nose: No congestion/rhinnorhea. Mouth/Throat: Mucous membranes are moist.  Cardiovascular: Normal rate, regular rhythm. Grossly normal heart sounds.  2+ radial pulses bilaterally. Respiratory: Normal respiratory effort.  No retractions. Lungs CTAB. Gastrointestinal: Soft and mildly tender to palpation in the bilateral lower  quadrants with no rebound or guarding. No distention. Musculoskeletal: No lower extremity tenderness nor edema.  Neurologic:  Normal speech and language. No gross focal neurologic deficits are appreciated.    ED Results / Procedures / Treatments   Labs (all labs ordered are listed, but only abnormal results are displayed) Labs Reviewed  COMPREHENSIVE METABOLIC PANEL WITH GFR - Abnormal; Notable for the following components:      Result Value   Glucose, Bld 113 (*)    All other components within normal limits  CBC WITH DIFFERENTIAL/PLATELET - Abnormal; Notable for the following components:   WBC 10.8 (*)    RBC 3.61 (*)    Hemoglobin 10.9 (*)    HCT 34.0 (*)    Neutro Abs 8.1 (*)    All other components within normal limits  URINALYSIS, W/ REFLEX TO CULTURE (INFECTION SUSPECTED) - Abnormal; Notable for the following components:   Color, Urine AMBER (*)    APPearance CLOUDY (*)    Hgb urine dipstick MODERATE (*)    Protein, ur 100 (*)    Leukocytes,Ua MODERATE (*)    Bacteria, UA MANY (*)  All other components within normal limits  CULTURE, BLOOD (SINGLE)  LACTIC ACID, PLASMA  PROTIME-INR    RADIOLOGY Pelvic ultrasound reviewed and interpreted by me with thickening of the endometrium.  PROCEDURES:  Critical Care performed: No  Procedures   MEDICATIONS ORDERED IN ED: Medications  doxycycline  (VIBRA -TABS) tablet 100 mg (has no administration in time range)  metroNIDAZOLE  (FLAGYL ) tablet 500 mg (has no administration in time range)     IMPRESSION / MDM / ASSESSMENT AND PLAN / ED COURSE  I reviewed the triage vital signs and the nursing notes.                              34 y.o. female with past medical history of asthma who presents to the ED complaining of increasing abdominal pain and vaginal bleeding over the past 24 hours with chills following vaginal delivery on 1/1.  Patient's presentation is most consistent with acute presentation with potential threat  to life or bodily function.  Differential diagnosis includes, but is not limited to, endometritis, retained products of conception, anemia, electrolyte abnormality, AKI.  Patient nontoxic-appearing and in no acute distress, vital signs are unremarkable and do not appear concerning for sepsis.  She has mild tenderness to palpation in the bilateral lower quadrants, ultrasound with some thickening of the endometrium but otherwise unremarkable.  Labs without significant leukocytosis, anemia, electrolyte abnormality, or AKI.  Lactic acid within normal limits.  Case discussed with CNM Delon Coe of [facility] clinic, who recommends outpatient management with antibiotics and close follow-up, states no indication for D&C at this time.  Outpatient management seems reasonable given no signs of sepsis and relatively mild symptoms.  We will start patient on doxycycline  and Flagyl , both of which are safe for short-term use during breast-feeding.  She was counseled to follow-up with OB/GYN and to return to the ED for new or worsening symptoms, patient agrees with plan.      FINAL CLINICAL IMPRESSION(S) / ED DIAGNOSES   Final diagnoses:  Endometritis     Rx / DC Orders   ED Discharge Orders     None        Note:  This document was prepared using Dragon voice recognition software and may include unintentional dictation errors.   Willo Dunnings, MD 06/16/24 0015  "

## 2024-06-16 MED ORDER — METRONIDAZOLE 500 MG PO TABS: 500.0000 mg | ORAL_TABLET | Freq: Three times a day (TID) | ORAL | 0 refills | Status: AC

## 2024-06-16 MED ORDER — DOXYCYCLINE HYCLATE 100 MG PO CAPS: 100.0000 mg | ORAL_CAPSULE | Freq: Two times a day (BID) | ORAL | 0 refills | Status: AC

## 2024-06-16 MED ORDER — DOXYCYCLINE HYCLATE 100 MG PO TABS
100.0000 mg | ORAL_TABLET | Freq: Once | ORAL | Status: AC
  Administered 2024-06-16: 100 mg via ORAL
  Filled 2024-06-16: qty 1

## 2024-06-16 MED ORDER — METRONIDAZOLE 500 MG PO TABS
500.0000 mg | ORAL_TABLET | Freq: Once | ORAL | Status: AC
  Administered 2024-06-16: 500 mg via ORAL
  Filled 2024-06-16: qty 1

## 2024-06-20 LAB — CULTURE, BLOOD (SINGLE)
Culture: NO GROWTH
Special Requests: ADEQUATE
